# Patient Record
Sex: Female | Born: 1985 | Hispanic: Yes | Marital: Married | Smoking: Never smoker
Health system: Southern US, Community
[De-identification: ages and names within clinical notes are randomized; demographics above are authoritative.]

## PROBLEM LIST (undated history)

## (undated) DIAGNOSIS — E119 Type 2 diabetes mellitus without complications: Secondary | ICD-10-CM

## (undated) DIAGNOSIS — Z789 Other specified health status: Secondary | ICD-10-CM

## (undated) HISTORY — PX: APPENDECTOMY: SHX54

---

## 2020-12-14 NOTE — L&D Delivery Note (Addendum)
LABOR COURSE Patient presented for an IOL on 8/25 for poorly controlled GDM. One arrival cervical exam was 2/40/-2. Induction was started with a dose of Cytotec at 2348 and placement of a foley balloon. Foley fell out at 0215. She was contracting spontaneously and was managed expectantly. She progressed to complete at 0944.   Delivery Note Called to room and patient was complete and pushing. Head delivered LOA. No nuchal cord present. Shoulders delivered easily. At [phone] a viable female was delivered via Vaginal, Spontaneous vertex presentation.  Infant with spontaneous cry, placed on mother's abdomen, dried and stimulated. Cord clamped x 2 after 1-minute delay, and cut by FOB under direct supervision. Cord blood drawn. Placenta delivered spontaneously with gentle cord traction. Appeared intact. Fundus firm with massage and Pitocin. TXA given due to increased bleeding after delivery of placenta. Labia, perineum, vagina, and cervix inspected with small vaginal floor abrasion that was hemostatic, not needing repair.   Patient continued to have uterine bleeding after delivery despite fundal massage, Pitocin, and TXA. Patient received rectal Cytotec 800 mcg in addition to Methergine with improvement in bleeding. EBL .   APGAR: 8, 9; Weight 3209 g   Cord: 3VC   Anesthesia: Epidural Episiotomy: None Lacerations: Small vaginal floor abrasion, hemostatic, not needing repair Est. Blood Loss (mL): 750 cc  Mom to postpartum.  Baby to Couplet care / Skin to Skin.  Alicia Amel, MD 08/08/21 10:29 AM    GME ATTESTATION:  I saw and evaluated the patient. I agree with the findings and the plan of care as documented in the resident's note. I was present and gloved for entire delivery and management of patient's care.   Significant EBL post-delivery. Will continue to monitor bleeding closely with plan for Jada placement if bleeding does not continue to improve.  Evalina Field, MD OB Fellow,  Faculty [facility], Center for [facility] 08/08/2021 4:40 PM

## 2021-03-16 ENCOUNTER — Inpatient Hospital Stay (HOSPITAL_BASED_OUTPATIENT_CLINIC_OR_DEPARTMENT_OTHER): Payer: Medicaid Other

## 2021-03-16 ENCOUNTER — Inpatient Hospital Stay (HOSPITAL_COMMUNITY)
Admission: AD | Admit: 2021-03-16 | Discharge: 2021-03-16 | Disposition: A | Discharge status: Home / Self Care | Payer: Medicaid Other | Attending: Obstetrics and Gynecology | Admitting: Obstetrics and Gynecology

## 2021-03-16 ENCOUNTER — Other Ambulatory Visit: Payer: Self-pay

## 2021-03-16 ENCOUNTER — Encounter (HOSPITAL_COMMUNITY): Payer: Self-pay | Admitting: Specialist

## 2021-03-16 DIAGNOSIS — R109 Unspecified abdominal pain: Secondary | ICD-10-CM

## 2021-03-16 DIAGNOSIS — M549 Dorsalgia, unspecified: Secondary | ICD-10-CM | POA: Diagnosis not present

## 2021-03-16 DIAGNOSIS — Z043 Encounter for examination and observation following other accident: Secondary | ICD-10-CM

## 2021-03-16 DIAGNOSIS — Z3A17 17 weeks gestation of pregnancy: Secondary | ICD-10-CM

## 2021-03-16 DIAGNOSIS — O322XX0 Maternal care for transverse and oblique lie, not applicable or unspecified: Secondary | ICD-10-CM | POA: Diagnosis not present

## 2021-03-16 DIAGNOSIS — W19XXXA Unspecified fall, initial encounter: Secondary | ICD-10-CM

## 2021-03-16 DIAGNOSIS — O9A212 Injury, poisoning and certain other consequences of external causes complicating pregnancy, second trimester: Secondary | ICD-10-CM | POA: Diagnosis not present

## 2021-03-16 DIAGNOSIS — O26899 Other specified pregnancy related conditions, unspecified trimester: Secondary | ICD-10-CM

## 2021-03-16 DIAGNOSIS — O26892 Other specified pregnancy related conditions, second trimester: Secondary | ICD-10-CM | POA: Diagnosis not present

## 2021-03-16 DIAGNOSIS — O99891 Other specified diseases and conditions complicating pregnancy: Secondary | ICD-10-CM

## 2021-03-16 DIAGNOSIS — Z3A18 18 weeks gestation of pregnancy: Secondary | ICD-10-CM | POA: Insufficient documentation

## 2021-03-16 DIAGNOSIS — O0932 Supervision of pregnancy with insufficient antenatal care, second trimester: Secondary | ICD-10-CM | POA: Diagnosis not present

## 2021-03-16 HISTORY — DX: Other specified health status: Z78.9

## 2021-03-16 LAB — URINALYSIS, ROUTINE W REFLEX MICROSCOPIC
Bilirubin Urine: NEGATIVE
Glucose, UA: NEGATIVE mg/dL
Hgb urine dipstick: NEGATIVE
Ketones, ur: 5 mg/dL — AB
Leukocytes,Ua: NEGATIVE
Nitrite: NEGATIVE
Protein, ur: NEGATIVE mg/dL
Specific Gravity, Urine: 1.027 (ref 1.005–1.030)
pH: 5 (ref 5.0–8.0)

## 2021-03-16 MED ORDER — CYCLOBENZAPRINE HCL 5 MG PO TABS
10.0000 mg | ORAL_TABLET | Freq: Once | ORAL | Status: AC
  Administered 2021-03-16: 10 mg via ORAL
  Filled 2021-03-16: qty 2

## 2021-03-16 MED ORDER — ACETAMINOPHEN 500 MG PO TABS
1000.0000 mg | ORAL_TABLET | Freq: Once | ORAL | Status: AC
  Administered 2021-03-16: 1000 mg via ORAL
  Filled 2021-03-16: qty 2

## 2021-03-16 MED ORDER — CYCLOBENZAPRINE HCL 10 MG PO TABS: 10.0000 mg | ORAL_TABLET | Freq: Two times a day (BID) | ORAL | 0 refills | Status: DC | PRN

## 2021-03-16 NOTE — Discharge Instructions (Signed)
Prenatal Care Providers   [facility] Haskel [phone] [facility]- [phone] [facility]- [phone]  Safe Medications in Pregnancy   Acne: Benzoyl Peroxide Salicylic Acid  Backache/Headache: Tylenol: 2 regular strength every 4 hours OR              2 Extra strength every 6 hours  Colds/Coughs/Allergies: Benadryl (alcohol free) 25 mg every 6 hours as needed Breath right strips Claritin Cepacol throat lozenges Chloraseptic throat spray Cold-Eeze- up to three times per day Cough drops, alcohol free Flonase (by prescription only) Guaifenesin Mucinex Robitussin DM (plain only, alcohol free) Saline nasal spray/drops Sudafed (pseudoephedrine) & Actifed ** use only after [redacted] weeks gestation and if you do not have high blood pressure Tylenol Vicks Vaporub Zinc lozenges Zyrtec   Constipation: Colace Ducolax suppositories Fleet enema Glycerin suppositories Metamucil Milk of magnesia Miralax Senokot Smooth move tea  Diarrhea: Kaopectate Imodium A-D  *NO pepto Bismol  Hemorrhoids: Anusol Anusol HC Preparation H Tucks  Indigestion: Tums Maalox Mylanta Zantac  Pepcid  Insomnia: Benadryl (alcohol free) 25mg  every 6 hours as needed Tylenol PM Unisom, no Gelcaps  Leg Cramps: Tums MagGel  Nausea/Vomiting:  Bonine Dramamine Emetrol Ginger extract Sea bands Meclizine  Nausea medication to take during pregnancy:  Unisom (doxylamine succinate 25 mg tablets) Take one tablet daily at bedtime. If symptoms are not adequately controlled, the dose can be increased to a maximum recommended dose of two tablets daily (1/2 tablet in the morning, 1/2 tablet mid-afternoon and one at bedtime). Vitamin B6 100mg  tablets. Take one tablet twice a day (up to 200 mg per day).  Skin Rashes: Aveeno products Benadryl cream or 25mg  every 6 hours as needed Calamine Lotion 1% cortisone cream  Yeast infection: Gyne-lotrimin 7 Monistat 7   **If taking  multiple medications, please check labels to avoid duplicating the same active ingredients **take medication as directed on the label ** Do not exceed 4000 mg of tylenol in 24 hours **Do not take medications that contain aspirin or ibuprofen

## 2021-03-16 NOTE — MAU Note (Signed)
Pt reports to mau with c/o lower back pain after falling in the shower 2 days ago.  Pt states she doesn't know how she fell or landed.  Pt states she is also having pain when the "baby moves".  Denies bleeding.  Reports she has not started prenatal care yet.

## 2021-03-16 NOTE — MAU Provider Note (Signed)
History     CSN: [id number]  Arrival date and time: 03/16/21 1530   Event Date/Time   First Provider Initiated Contact with Patient 03/16/21 1833      Chief Complaint  Patient presents with  . Fall  . Back Pain   HPI Diana Rodgers is a 35 y.o. G3P2002 at [redacted]w[redacted]d who presents stating 2 days ago she was in the shower and passed out. She states the water was really hot and she feels like she got over heated. She is unsure how she fell or if she hit anything. She is unsure how long she was unconscious. She states since then she she has had lower back and lower abdominal pain and she has been unable to sleep because of it. She denies any vaginal bleeding or leaking of fluid. She states it hurts when the baby moves. She has not had any care or ultrasounds yet.   OB History    Gravida  3   Para  2   Term  2   Preterm      AB      Living  2     SAB      IAB      Ectopic      Multiple      Live Births  2           Past Medical History:  Diagnosis Date  . Medical history non-contributory     Past Surgical History:  Procedure Laterality Date  . APPENDECTOMY      History reviewed. No pertinent family history.  Social History   Tobacco Use  . Smoking status: Never Smoker  . Smokeless tobacco: Never Used  Vaping Use  . Vaping Use: Never used  Substance Use Topics  . Alcohol use: Not Currently  . Drug use: Never    Allergies: No Known Allergies  No medications prior to admission.    Review of Systems  Constitutional: Negative.  Negative for fatigue and fever.  HENT: Negative.   Respiratory: Negative.  Negative for shortness of breath.   Cardiovascular: Negative.  Negative for chest pain.  Gastrointestinal: Positive for abdominal pain. Negative for constipation, diarrhea, nausea and vomiting.  Genitourinary: Negative.  Negative for dysuria, vaginal bleeding and vaginal discharge.  Musculoskeletal: Positive for back pain.  Neurological: Negative.   Negative for dizziness and headaches.   Physical Exam   Blood pressure 118/64, pulse 72, temperature 98.1 F (36.7 C), temperature source Oral, resp. rate 16, SpO2 97 %.  Physical Exam Vitals and nursing note reviewed.  Constitutional:      General: She is not in acute distress.    Appearance: She is well-developed.  HENT:     Head: Normocephalic.  Eyes:     Pupils: Pupils are equal, round, and reactive to light.  Cardiovascular:     Rate and Rhythm: Normal rate and regular rhythm.     Heart sounds: Normal heart sounds.  Pulmonary:     Effort: Pulmonary effort is normal. No respiratory distress.     Breath sounds: Normal breath sounds.  Abdominal:     General: Bowel sounds are normal. There is no distension.     Palpations: Abdomen is soft.     Tenderness: There is no abdominal tenderness.  Skin:    General: Skin is warm and dry.  Neurological:     Mental Status: She is alert and oriented to person, place, and time.  Psychiatric:        Behavior:  Behavior normal.        Thought Content: Thought content normal.        Judgment: Judgment normal.    FHT: 147 bpm  Cervix: closed/thick/posterior  MAU Course  Procedures Results for orders placed or performed during the hospital encounter of 03/16/21 (from the past 24 hour(s))  Urinalysis, Routine w reflex microscopic Urine, Clean Catch     Status: Abnormal   Collection Time: 03/16/21  6:18 PM  Result Value Ref Range   Color, Urine AMBER (A) YELLOW   APPearance HAZY (A) CLEAR   Specific Gravity, Urine 1.027 1.005 - 1.030   pH 5.0 5.0 - 8.0   Glucose, UA NEGATIVE NEGATIVE mg/dL   Hgb urine dipstick NEGATIVE NEGATIVE   Bilirubin Urine NEGATIVE NEGATIVE   Ketones, ur 5 (A) NEGATIVE mg/dL   Protein, ur NEGATIVE NEGATIVE mg/dL   Nitrite NEGATIVE NEGATIVE   Leukocytes,Ua NEGATIVE NEGATIVE   MDM UA [place] MFM OB Limited- no evidence of abruption or previa Tylenol- patient reports improvement of abdominal pain but still  rates back pain a 7/10 Flexeril PO  Assessment and Plan   1. Back pain affecting pregnancy in second trimester   2. Abdominal pain affecting pregnancy   3. [redacted] weeks gestation of pregnancy    -Discharge home in stable condition -Second trimester precautions discussed -Patient advised to follow-up with OB of choice to establish prenatal care -Patient may return to MAU as needed or if her condition were to change or worsen  Rolm Bookbinder CNM 03/16/2021, 6:33 PM

## 2021-04-08 ENCOUNTER — Other Ambulatory Visit: Payer: Self-pay

## 2021-04-08 ENCOUNTER — Ambulatory Visit (INDEPENDENT_AMBULATORY_CARE_PROVIDER_SITE_OTHER): Payer: Medicaid Other | Admitting: *Deleted

## 2021-04-08 VITALS — BP 115/73 | HR 82 | Temp 98.1°F | Ht 62.0 in | Wt 161.0 lb

## 2021-04-08 DIAGNOSIS — O0930 Supervision of pregnancy with insufficient antenatal care, unspecified trimester: Secondary | ICD-10-CM | POA: Insufficient documentation

## 2021-04-08 DIAGNOSIS — Z3480 Encounter for supervision of other normal pregnancy, unspecified trimester: Secondary | ICD-10-CM | POA: Insufficient documentation

## 2021-04-08 NOTE — Progress Notes (Signed)
   Location: [facility]  Patient: clinic Provider: clinic  PRENATAL INTAKE SUMMARY  Ms. Murdoch presents today New OB Nurse Interview.  OB History    Gravida  3   Para  2   Term  2   Preterm      AB      Living  2     SAB      IAB      Ectopic      Multiple      Live Births  2          I have reviewed the patient's medical, obstetrical, social, and family histories, medications, and available lab results.  SUBJECTIVE She has no unusual complaints  OBJECTIVE Initial Nurse interview for history/labs (New OB)  EDD: 08/22/21 by Ronny Bacon: [redacted]w[redacted]d G3P2002  FHT: 148  GENERAL APPEARANCE: alert, well appearing, in no apparent distress, oriented to person, place and time  LATE TO CARE  ASSESSMENT Normal pregnancy  PLAN Prenatal care:  [facility] Pnl/HIV/Hep C OB Urine Culture GC/CT/PAP at next visit with Raelyn Mora, CNM HgbEval/SMA/CF ([facility]) Panorama A1C Ultrasound complete MFM >14 week for anatomy scan  Follow Up Instructions:   I discussed the assessment and treatment plan with the patient. The patient was provided an opportunity to ask questions and all were answered. The patient agreed with the plan and demonstrated an understanding of the instructions.   The patient was advised to call back or seek an in-person evaluation if the symptoms worsen or if the condition fails to improve as anticipated.  I provided 40 minutes of  face-to-face time during this encounter.  Clovis Pu, RN

## 2021-04-08 NOTE — Patient Instructions (Addendum)
Genetic Screening Results Information: You are having genetic testing called [facility] today.  It will take approximately 2 weeks before the results are available.  To get your results, you need Internet access to a web browser to search [place]/MyChart (the direct app on your phone will not give you these results).  Then select Lab Scanned and click on the blue hyper link that says View Image to see your Panorama results.  You can also use the directions on the purple card given to look up your results directly on the [place] website.   Second Trimester of Pregnancy  The second trimester of pregnancy is from week 13 through week 27. This is also called months 4 through 6 of pregnancy. This is often the time when you feel your best. During the second trimester:  Morning sickness is less or has stopped.  You may have more energy.  You may feel hungry more often. At this time, your unborn baby (fetus) is growing very fast. At the end of the sixth month, the unborn baby may be up to 12 inches long and weigh about 1 pounds. You will likely start to feel the baby move between 16 and 20 weeks of pregnancy. Body changes during your second trimester Your body continues to go through many changes during this time. The changes vary and generally return to normal after the baby is born. Physical changes  You will gain more weight.  You may start to get stretch marks on your hips, belly (abdomen), and breasts.  Your breasts will grow and may hurt.  Dark spots or blotches may develop on your face.  A dark line from your belly button to the pubic area (linea nigra) may appear.  You may have changes in your hair. Health changes  You may have headaches.  You may have heartburn.  You may have trouble pooping (constipation).  You may have hemorrhoids or swollen, bulging veins (varicose veins).  Your gums may bleed.  You may pee (urinate) more often.  You may have back pain. Follow  these instructions at home: Medicines  Take over-the-counter and prescription medicines only as told by your doctor. Some medicines are not safe during pregnancy.  Take a prenatal vitamin that contains at least 600 micrograms (mcg) of folic acid. Eating and drinking  Eat healthy meals that include: ? Fresh fruits and vegetables. ? Whole grains. ? Good sources of protein, such as meat, eggs, or tofu. ? Low-fat dairy products.  Avoid raw meat and unpasteurized juice, milk, and cheese.  You may need to take these actions to prevent or treat trouble pooping: ? Drink enough fluids to keep your pee (urine) pale yellow. ? Eat foods that are high in fiber. These include beans, whole grains, and fresh fruits and vegetables. ? Limit foods that are high in fat and sugar. These include fried or sweet foods. Activity  Exercise only as told by your doctor. Most people can do their usual exercise during pregnancy. Try to exercise for 30 minutes at least 5 days a week.  Stop exercising if you have pain or cramps in your belly or lower back.  Do not exercise if it is too hot or too humid, or if you are in a place of great height (high altitude).  Avoid heavy lifting.  If you choose to, you may have sex unless your doctor tells you not to. Relieving pain and discomfort  Wear a good support bra if your breasts are sore.  Take  warm water baths (sitz baths) to soothe pain or discomfort caused by hemorrhoids. Use hemorrhoid cream if your doctor approves.  Rest with your legs raised (elevated) if you have leg cramps or low back pain.  If you develop bulging veins in your legs: ? Wear support hose as told by your doctor. ? Raise your feet for 15 minutes, 3-4 times a day. ? Limit salt in your food. Safety  Wear your seat belt at all times when you are in a car.  Talk with your doctor if someone is hurting you or yelling at you a lot. Lifestyle  Do not use hot tubs, steam rooms, or  saunas.  Do not douche. Do not use tampons or scented sanitary pads.  Avoid cat litter boxes and soil used by cats. These carry germs that can harm your baby and can cause a loss of your baby by miscarriage or stillbirth.  Do not use herbal medicines, illegal drugs, or medicines that are not approved by your doctor. Do not drink alcohol.  Do not smoke or use any products that contain nicotine or tobacco. If you need help quitting, ask your doctor. General instructions  Keep all follow-up visits. This is important.  Ask your doctor about local prenatal classes.  Ask your doctor about the right foods to eat or for help finding a counselor. Where to find more information  American Pregnancy Association: americanpregnancy.org  [employer] of [employer]ians [employer] Gynecologists: www.acog.org  Office on [employer]: KeywordPortfolios.com.br Contact a doctor if:  You have a headache that does not go away when you take medicine.  You have changes in how you see, or you see spots in front of your eyes.  You have mild cramps, pressure, or pain in your lower belly.  You continue to feel like you may vomit (nauseous), you vomit, or you have watery poop (diarrhea).  You have bad-smelling fluid coming from your vagina.  You have pain when you pee or your pee smells bad.  You have very bad swelling of your face, hands, ankles, feet, or legs.  You have a fever. Get help right away if:  You are leaking fluid from your vagina.  You have spotting or bleeding from your vagina.  You have very bad belly cramping or pain.  You have trouble breathing.  You have chest pain.  You faint.  You have not felt your baby move for the time period told by your doctor.  You have new or increased pain, swelling, or redness in an arm or leg. Summary  The second trimester of pregnancy is from week 13 through week 27 (months 4 through 6).  Eat healthy meals.  Exercise as told by your  doctor. Most people can do their usual exercise during pregnancy.  Do not use herbal medicines, illegal drugs, or medicines that are not approved by your doctor. Do not drink alcohol.  Call your doctor if you get sick or if you notice anything unusual about your pregnancy. This information is not intended to replace advice given to you by your health care provider. Make sure you discuss any questions you have with your health care provider. Document Revised: 05/08/2020 Document Reviewed: 03/14/2020 Elsevier Patient Education  2021 [place].  Warning Signs During Pregnancy During pregnancy, your body goes through many changes. Some changes may be uncomfortable, but most do not represent a serious problem. However, it is important to learn when certain signs and symptoms may indicate a problem. Talk with your health  care provider about your current health and any medical conditions you have. Make sure you know the symptoms to watch for and report. How does this affect me? Warning signs during pregnancy Let your health care provider know if you have any of the following warning signs:  Dizziness or feeling faint.  Nausea, vomiting, or diarrhea that lasts 24 hours or longer.  Spotting or bleeding from your vagina.  Abdominal cramping or pain in your pelvis or lower back.  Shortness of breath, difficulty breathing, or chest pain.  New or increased pain, swelling, or redness in an arm or leg.  Your baby is moving less than usual or is not moving. You should also watch for signs of a serious medical condition called preeclampsia. This may include:  A severe, throbbing headache that does not go away.  Vision changes, such as blurred or double vision, light sensitivity, or seeing spots in front of your eyes.  Sudden or extreme swelling of your face, hands, legs, or feet. Pregnancy causes changes that may make it more likely for you to get an infection. Let your health care provider  know if you have signs of infection, such as:  A fever.  A bad-smelling vaginal discharge.  Pain or burning when you urinate. How does this affect my baby? Throughout your pregnancy, always report any of the warning signs of a problem to your health care provider. This can help prevent complications that may affect your baby, including:  Increased risk for premature birth.  Infection that may be transmitted to your baby.  Increased risk for stillbirth. Follow these instructions at home:  Take over-the-counter and prescription medicines only as told by your health care provider.  Keep all follow-up visits. This is important.   Where to find more information  Office on Women's Health: DexterApartments.fr  [employer] of [employer]stetricians and Gynecologists: SwimmingTub.com.br Contact a health care provider if:  You have any warning signs of problems during your pregnancy.  Any of the following apply to you during your pregnancy: ? You have strong emotions, such as sadness or anxiety, that interfere with work or personal relationships. ? You feel unsafe in your home. ? You are using tobacco products, alcohol, or drugs, and you need help to stop. Get help right away if:  You have signs or symptoms of labor before 37 weeks of pregnancy. These include: ? Contractions that are 5 minutes or less apart, or that increase in frequency, intensity, or length. ? Sudden, sharp abdominal pain or low back pain. ? Uncontrolled gush or trickle of fluid from your vagina. Summary  Always report any warning signs to your health care provider to prevent complications that may affect both you and your baby.  Talk with your health care provider about your current health and any medical conditions you have. Make sure you know the symptoms to watch for and report.  Keep all follow-up visits. This is important. This information is not intended to replace advice given to you by  your health care provider. Make sure you discuss any questions you have with your health care provider. Document Revised: 05/08/2020 Document Reviewed: 04/05/2020 Elsevier Patient Education  2021 [place] [facility]ACTICE PHYSICIANS  [facility] [address] [place], [place] [address] Phone - [phone]   Fax - [place] [address] [place], [place]  [address] Phone - [phone]   Fax - [phone]  [place] [address]  [place], [place]  [address] Phone - [phone]   Fax - [phone]  [facility] [address] [place], [place]  [address] Phone - [phone]   Fax - [phone]  [facility] [address] [place], [place]  [address] Phone - [phone]   Fax - [phone]  [facility] [address] [place], [place]  [address] Phone - [phone]   Fax - [phone]  [facility] [address] [place], [place]  [address] Phone - [phone]   Fax - [phone]  [facility] [address] [place], [place]  [address] Phone - [phone]   Fax - [phone]  [facility] [address] [place], [place]  [address] Phone - [phone]   Fax - [phone] [facility] [address] [place], [place]  [address] Phone - [phone]   Fax - [phone]  [facility] [address] [place], [place]  [address] Phone - [phone]   Fax - [phone]  [facility] [address] [place], [place]  [address] Phone - [phone]   Fax - [phone]  [facility] [address] [place], [place]  [address] Phone - [phone]   Fax - [phone]  [facility] [address] [place], [place]  [address] Phone - [phone]  [facility] [address] [place], [place]  [address] Phone - [phone]   Fax - [phone]  [facility] [address] [place], [place]  [address] Phone - [phone]   Fax - [phone]  [facility] [address] [place], [place]  [address] Phone - [phone]   Fax - [phone]  [facility] [address] [place], [place]  [address] Phone - [phone]   Fax - [phone]   [facility] [address] [place], [place]  [address] Phone - [phone]   Fax - [phone]  [facility] [address] [place], [place]  [address] Phone - [phone]   Fax - [phone]  [facility] [address] [place], [place]  [address] Phone - [phone]   Fax - [phone]  [facility] [address] [place], [place]  [address] Phone - [phone]   Fax - [phone]  [facility] [address] [place], [place]  [address] Phone - [phone]   Fax - [phone] [facility] [address] [place], [place]  [address] Phone - [phone]   Fax - [phone]  [place] [address] [place],   [address] Phone - [phone]   Fax - [phone]  [facility] [address] [place],   [address] Phone - [phone]   Fax - [phone]   

## 2021-04-10 LAB — CBC/D/PLT+RPR+RH+ABO+RUB AB...
Antibody Screen: NEGATIVE
Basophils Absolute: 0.1 10*3/uL (ref 0.0–0.2)
Basos: 0 %
EOS (ABSOLUTE): 0.1 10*3/uL (ref 0.0–0.4)
Eos: 1 %
HCV Ab: <0.1 s/co ratio (ref 0.0–0.9)
HIV Screen 4th Generation wRfx: NONREACTIVE
Hematocrit: 41.5 % (ref 34.0–46.6)
Hemoglobin: 14.3 g/dL (ref 11.1–15.9)
Hepatitis B Surface Ag: NEGATIVE
Immature Grans (Abs): 0.2 10*3/uL — ABNORMAL HIGH (ref 0.0–0.1)
Immature Granulocytes: 2 %
Lymphocytes Absolute: 2.1 10*3/uL (ref 0.7–3.1)
Lymphs: 17 %
MCH: 31.5 pg (ref 26.6–33.0)
MCHC: 34.5 g/dL (ref 31.5–35.7)
MCV: 91 fL (ref 79–97)
Monocytes Absolute: 0.7 10*3/uL (ref 0.1–0.9)
Monocytes: 6 %
Neutrophils Absolute: 9.4 10*3/uL — ABNORMAL HIGH (ref 1.4–7.0)
Neutrophils: 74 %
Platelets: 312 10*3/uL (ref 150–450)
RBC: 4.54 x10E6/uL (ref 3.77–5.28)
RDW: 12.2 % (ref 11.7–15.4)
RPR Ser Ql: NONREACTIVE
Rh Factor: POSITIVE
Rubella Antibodies, IGG: 0.9 index — ABNORMAL LOW (ref >0.99)
WBC: 12.6 10*3/uL — ABNORMAL HIGH (ref 3.4–10.8)

## 2021-04-10 LAB — AFP, SERUM, OPEN SPINA BIFIDA
AFP MoM: 1.91
AFP Value: 109.2 ng/mL
Gest. Age on Collection Date: 20 weeks
Maternal Age At EDD: 32.9 yr
OSBR Risk 1 IN: 987
Test Results:: NEGATIVE
Weight: 161 [lb_av]

## 2021-04-10 LAB — HEMOGLOBIN A1C
Est. average glucose Bld gHb Est-mCnc: 105 mg/dL
Hgb A1c MFr Bld: 5.3 % (ref 4.8–5.6)

## 2021-04-10 LAB — URINE CULTURE, OB REFLEX

## 2021-04-10 LAB — HCV INTERPRETATION

## 2021-04-10 LAB — CULTURE, OB URINE

## 2021-04-22 ENCOUNTER — Telehealth: Payer: Self-pay | Admitting: *Deleted

## 2021-04-22 NOTE — Telephone Encounter (Signed)
Patient called requesting prenatal labs. Patient verified DOB. Prenatal labs normal, other than rubella non-immune. Advised patient that she will be offered MMR after delivery at the hospital. If does not want injection at the hospital, she can call her PCP or local health department for an appointment. Genetic screening all negative.   Derl Barrow, RN

## 2021-04-24 ENCOUNTER — Ambulatory Visit (INDEPENDENT_AMBULATORY_CARE_PROVIDER_SITE_OTHER): Payer: Medicaid Other | Admitting: Obstetrics and Gynecology

## 2021-04-24 ENCOUNTER — Other Ambulatory Visit: Payer: Self-pay

## 2021-04-24 ENCOUNTER — Other Ambulatory Visit (HOSPITAL_COMMUNITY)
Admission: RE | Admit: 2021-04-24 | Discharge: 2021-04-24 | Disposition: A | Discharge status: Home / Self Care | Payer: Medicaid Other | Source: Ambulatory Visit | Attending: Obstetrics and Gynecology | Admitting: Obstetrics and Gynecology

## 2021-04-24 ENCOUNTER — Encounter: Payer: Self-pay | Admitting: Obstetrics and Gynecology

## 2021-04-24 VITALS — BP 109/69 | HR 80 | Temp 98.1°F | Wt 162.6 lb

## 2021-04-24 DIAGNOSIS — Z124 Encounter for screening for malignant neoplasm of cervix: Secondary | ICD-10-CM | POA: Insufficient documentation

## 2021-04-24 DIAGNOSIS — O26892 Other specified pregnancy related conditions, second trimester: Secondary | ICD-10-CM

## 2021-04-24 DIAGNOSIS — Z3A22 22 weeks gestation of pregnancy: Secondary | ICD-10-CM | POA: Insufficient documentation

## 2021-04-24 DIAGNOSIS — B373 Candidiasis of vulva and vagina: Secondary | ICD-10-CM | POA: Diagnosis not present

## 2021-04-24 DIAGNOSIS — Z3480 Encounter for supervision of other normal pregnancy, unspecified trimester: Secondary | ICD-10-CM | POA: Diagnosis not present

## 2021-04-24 DIAGNOSIS — O0930 Supervision of pregnancy with insufficient antenatal care, unspecified trimester: Secondary | ICD-10-CM

## 2021-04-24 DIAGNOSIS — O3432 Maternal care for cervical incompetence, second trimester: Secondary | ICD-10-CM

## 2021-04-24 DIAGNOSIS — O0932 Supervision of pregnancy with insufficient antenatal care, second trimester: Secondary | ICD-10-CM

## 2021-04-24 DIAGNOSIS — R102 Pelvic and perineal pain: Secondary | ICD-10-CM | POA: Diagnosis not present

## 2021-04-24 DIAGNOSIS — B3731 Acute candidiasis of vulva and vagina: Secondary | ICD-10-CM

## 2021-04-24 MED ORDER — TERCONAZOLE 0.4 % VA CREA: 1.0000 | TOPICAL_CREAM | Freq: Every day | VAGINAL | 0 refills | Status: AC

## 2021-04-24 MED ORDER — COMFORT FIT MATERNITY SUPP SM MISC: 1.0000 [IU] | Freq: Every day | 0 refills | Status: DC | PRN

## 2021-04-24 NOTE — Progress Notes (Signed)
INITIAL OBSTETRICAL VISIT Patient name: Diana Rodgers MRN [id number]  Date of birth: 1986/07/27 Chief Complaint:   Initial Prenatal Visit  History of Present Illness:   Diana Rodgers is a 35 y.o. G13P2002 Hispanic female at [redacted]w[redacted]d by LMP with an Estimated Date of Delivery: 08/22/21 being seen today for her initial obstetrical visit.  Her obstetrical history is significant for multigravida and late to prenatal care. This is a planned pregnancy. Her previous regnancies were uncomplicated. She and the father of the baby (FOB) "Jesus" live together. She has a support system that consists of S.O./children/her family/friends. Today she reports "lots of cramping and pelvic pressure. She reports the crmaping starts at the top of her belly and goes down. 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 She still takes Flexeril prn; helps a little.   No LMP recorded. Patient is pregnant. Last pap unknown. Results were: unknown Review of Systems:   Pertinent items are noted in HPI Denies cramping/contractions, leakage of fluid, vaginal bleeding, abnormal vaginal discharge w/ itching/odor/irritation, headaches, visual changes, shortness of breath, chest pain, abdominal pain, severe nausea/vomiting, or problems with urination or bowel movements unless otherwise stated above.  Pertinent History Reviewed:  Reviewed past medical,surgical, social, obstetrical and family history.  Reviewed problem list, medications and allergies. OB History  Gravida Para Term Preterm AB Living  3 2 2     2   SAB IAB Ectopic Multiple Live Births          2    # Outcome Date GA Lbr Len/2nd Weight Sex Delivery Anes PTL Lv  3 Current           2 Term 02/22/12 [redacted]w[redacted]d  8 lb (3.629 kg) M Vag-Spont EPI N LIV  1 Term 09/13/10 [redacted]w[redacted]d  7 lb (3.175 kg) F Vag-Spont EPI N LIV   Physical Assessment:   Vitals:   04/24/21 1007  BP: 109/69  Pulse: 80   Temp: 98.1 F (36.7 C)  Weight: 162 lb 9.6 oz (73.8 kg)  Body mass index is 29.74 kg/m.       Physical Examination:  General appearance - well appearing, and in no distress  Mental status - alert, oriented to person, place, and time  Psych:  She has a normal mood and affect  Skin - warm and dry, normal color, no suspicious lesions noted  Chest - effort normal, all lung fields clear to auscultation bilaterally  Heart - normal rate and regular rhythm  Abdomen - soft, nontender  Extremities:  No swelling or varicosities noted  Pelvic - VULVA: normal appearing vulva with no masses, tenderness or lesions  VAGINA: normal appearing vagina with normal color and discharge, no lesions.   CERVIX: normal appearing cervix without discharge or lesions, no CMT  Thin prep pap is done with reflex HR HPV cotesting   FHTs by doppler: 144 bpm  Assessment & Plan:  1. High-Risk Pregnancy G3P2002 at [redacted]w[redacted]d with an Estimated Date of Delivery: 08/22/21   2. Initial OB visit - Welcomed to practice and introduced self to patient in addition to discussing other advanced practice providers that she may be seeing at this practice - Congratulated patient - Anticipatory guidance on upcoming appointments - Educated on COVID19 and pregnancy and the integration of virtual appointments  - Educated on babyscripts app- patient reports she has not received email, encouraged to look in spam folder and to call office if she  still has not received email - patient verbalizes understanding    3. Supervision of other normal pregnancy, antepartum - Cytology - PAP( [place]) - Cervicovaginal ancillary only( [place])  4. Late prenatal care affecting pregnancy, antepartum  5. [redacted] weeks gestation of pregnancy  6. Pap smear for cervical cancer screening - Cytology - PAP( [place])  7. Pelvic pressure in pregnancy, antepartum, second trimester  - Rx for Elastic Bandages & Supports (COMFORT FIT MATERNITY SUPP SM)  MISC  8. Candida vaginitis  - Rx for terconazole (TERAZOL 7) 0.4 % vaginal cream  9. Premature cervical dilation in second trimester - Cervical length measurement with anatomy U/S tomorrow - Return to office for cervical check next week    Meds:  Meds ordered this encounter  Medications  . DISCONTD: Elastic Bandages & Supports (COMFORT FIT MATERNITY SUPP SM) MISC    Sig: 1 Units by Does not apply route daily as needed.    Dispense:  1 each    Refill:  0    Order Specific Question:   Supervising Provider    Answer:   Reva Bores [[id number]]  . Elastic Bandages & Supports (COMFORT FIT MATERNITY SUPP SM) MISC    Sig: 1 Units by Does not apply route daily as needed.    Dispense:  1 each    Refill:  0    Order Specific Question:   Supervising Provider    Answer:   Reva Bores [[id number]]  . terconazole (TERAZOL 7) 0.4 % vaginal cream    Sig: Place 1 applicator vaginally at bedtime for 7 days.    Dispense:  45 g    Refill:  0    Order Specific Question:   Supervising Provider    Answer:   Reva Bores [[id number]]    Initial labs obtained Continue prenatal vitamins Reviewed n/v relief measures and warning s/s to report Reviewed recommended weight gain based on pre-gravid BMI Encouraged well-balanced diet Genetic Screening discussed: results reviewed Cystic fibrosis, SMA, Fragile X screening discussed results reviewed The nature of [facility] with multiple MDs and other Advanced Practice Providers was explained to patient; also emphasized that residents, students are part of our team.  Discussed optimized OB schedule and video visits. Advised can have an in-office visit whenever she feels she needs to be seen.  Does not have own BP cuff. BP cuff Rx faxed today. Explained to patient that BP will be mailed to her house. Check BP weekly, let us know if >140/90. Advised to call during normal business hours and there is an after-hours nurse line available.     Follow-up: Return in about 1 week (around 05/01/2021) for Return OB visit - cervical check.   No orders of the defined types were placed in this encounter.   Raelyn Mora MSN, CNM 04/24/2021

## 2021-04-24 NOTE — Patient Instructions (Addendum)
PREGNANCY SUPPORT BELT: You are not alone, Seventy-five percent of women have some sort of abdominal or back pain at some point in their pregnancy. Your baby is growing at a fast pace, which means that your whole body is rapidly trying to adjust to the changes. As your uterus grows, your back may start feeling a bit under stress and this can result in back or abdominal pain that can go from mild, and therefore bearable, to severe pains that will not allow you to sit or lay down comfortably, When it comes to dealing with pregnancy-related pains and cramps, some pregnant women usually prefer natural remedies, which the market is filled with nowadays. For example, wearing a pregnancy support belt can help ease and lessen your discomfort and pain.  WHAT ARE THE BENEFITS OF WEARING A PREGNANCY SUPPORT BELT? A pregnancy support belt provides support to the lower portion of the belly taking some of the weight of the growing uterus and distributing to the other parts of your body. It is designed make you comfortable and gives you extra support. Over the years, the pregnancy apparel market has been studying the needs and wants of pregnant women and they have come up with the most comfortable pregnancy support belts that woman could ever ask for. In fact, you will no longer have to wear a stretched-out or bulky pregnancy belt that is visible underneath your clothes and makes you feel even more uncomfortable. Nowadays, a pregnancy support belt is made of comfortable and stretchy materials that will not irritate your skin but will actually make you feel at ease and you will not even notice you are wearing it. They are easy to put on and adjust during the day and can be worn at night for additional support.  BENEFITS: . Relives Back pain . Relieves Abdominal Muscle and Leg Pain . Stabilizes the Pelvic Ring . Offers a Cushioned Abdominal Lift Pad . Relieves pressure on the Sciatic Nerve Within Minutes WHERE TO GET  YOUR PREGNANCY BELT:   [facility] [address] [place], [place] [address] [phone]  [facility] [address], [address] [place], [place] [address] [phone]    Preventing Preterm Birth Preterm birth is when a baby is delivered between 20 weeks and 37 weeks of pregnancy. A full-term pregnancy lasts for at least 37 weeks. Preterm birth can be dangerous for your baby because the last few weeks of pregnancy are an important time for your baby to grow and reach a normal birth weight. How can preterm birth affect my baby? Complications of preterm birth may include:  Breathing problems.  Brain damage that affects movement and coordination (cerebral palsy).  Trouble with feeding.  Problems with vision or hearing.  Infections or inflammation of the digestive tract (colitis).  Developmental delays and learning disabilities.  Low birth weight or very low birth weight.  Higher risk for diabetes, heart disease, and high blood pressure later in life. What can increase my risk of having a preterm birth? The exact cause of preterm birth is unknown. The following factors make you more likely to have a preterm birth:  Being diagnosed with placenta previa. This is a condition in which the placenta covers the lowest part of your uterus (cervix), which opens into the vagina.  Certain conditions of your current and past pregnancies, such as: ? Having had a preterm birth before. ? Being pregnant with multiples. ? Waiting less than 6 months between giving birth and becoming pregnant  again. ? Certain abnormalities in your unborn baby. ? Vaginal bleeding during pregnancy. ? Becoming pregnant through in vitro fertilization (IVF).  Being overweight or underweight.  Medical history of: ? STIs (sexually transmitted infections) or other infections of the urinary tract and the vagina. ? Long-term (chronic) illnesses, such as blood clotting  problems, diabetes, or high blood pressure. ? Short cervix.  Lifestyle and environmental factors, such as: ? Using tobacco products or drugs. ? Drinking alcohol. ? Having stress and no social support. ? Violence in the home (domestic violence). ? Being exposed to certain chemicals or pollutants in the environment. What actions can I take to prevent preterm birth? Medical care The most important thing you can do to lower your risk for preterm birth is to get routine medical care during pregnancy (prenatal care). Keep all follow-up visits as told by your health care provider. This is important.  If you have a high risk of preterm birth:  You may be referred to a health care provider who specializes in managing high-risk pregnancies (perinatologist).  You may be given medicine to help prevent preterm birth. Lifestyle Certain lifestyle changes can also lower your risk of preterm birth:  Wait at least 6 months after a pregnancy to become pregnant again.  Get to a healthy weight before getting pregnant. If you are overweight, work with your health care provider to safely lose weight.  Do not use any products that contain nicotine or tobacco, such as cigarettes, e-cigarettes, and chewing tobacco. If you need help quitting, ask your health care provider.  Do not drink alcohol.  Do not use drugs.  Eat a healthy diet.  Manage other medical problems, such as diabetes or high blood pressure.   Where to find support For more support, consider:  Talking with your health care provider.  Talking with a therapist or substance abuse counselor, if you need help quitting.  Working with a Data processing manager or a Systems analyst to maintain a healthy weight.  Joining a support group. Where to find more information Learn more about preventing preterm birth from:  Centers for Disease Control and Prevention: TonerPromos.no  March of Dimes: marchofdimes.org  American Pregnancy Association:  americanpregnancy.org Contact a health care provider if: You have any of the following signs or symptoms of preterm labor before 37 weeks:  A change or increase in vaginal discharge.  Fluid leaking from your vagina.  Pressure or cramps in your lower abdomen.  A backache that does not go away or gets worse.  Regular tightening (contractions) in your lower abdomen. Get help right away if:  You are having regular painful contractions every 5 minutes or less.  Your water breaks. Summary  Preterm birth means having your baby during weeks 20-37 of pregnancy.  Preterm birth may put your baby at risk for physical and mental problems.  The exact cause of preterm birth is unknown. However, being diagnosed with placenta previa or having vaginal bleeding or an STI (sexually transmitted infection) increases your risk for preterm birth.  Getting good prenatal care can help prevent preterm birth. Keep all follow-up visits as told by your health care provider. This is important.  Contact a health care provider if you have signs or symptoms of preterm labor. This information is not intended to replace advice given to you by your health care provider. Make sure you discuss any questions you have with your health care provider. Document Revised: 11/06/2019 Document Reviewed: 11/06/2019 Elsevier Patient Education  2021 [employer]. Preterm Labor The  normal length of a pregnancy is 39-41 weeks. Preterm labor is when labor starts before 37 completed weeks of pregnancy. Babies who are born prematurely and survive may not be fully developed and may be at an increased risk for long-term problems such as cerebral palsy, developmental delays, and vision and hearing problems. Babies who are born too early may have problems soon after birth. Problems may include regulating blood sugar, body temperature, heart rate, and breathing rate. These babies often have trouble with feeding. The risk of having problems  is highest for babies who are born before 34 weeks of pregnancy. What are the causes? The exact cause of this condition is not known. What increases the risk? You are more likely to have preterm labor if you have certain risk factors that relate to your medical history, problems with present and past pregnancies, and lifestyle factors. Medical history  You have abnormalities of the uterus, including a short cervix.  You have STIs (sexually transmitted infections), or other infections of the urinary tract and the vagina.  You have chronic illnesses, such as blood clotting problems, diabetes, or high blood pressure.  You are overweight or underweight. Present and past pregnancies  You have had preterm labor before.  You are pregnant with twins or other multiples.  You have been diagnosed with a condition in which the placenta covers your cervix (placenta previa).  You waited less than 6 months between giving birth and becoming pregnant again.  Your unborn baby has some abnormalities.  You have vaginal bleeding during pregnancy.  You became pregnant through in vitro fertilization (IVF). Lifestyle and environmental factors  You use tobacco products.  You drink alcohol.  You use street drugs.  You have stress and no social support.  You experience domestic violence.  You are exposed to certain chemicals or environmental pollutants. Other factors  You are younger than age 61 or older than age 59. What are the signs or symptoms? Symptoms of this condition include:  Cramps similar to those that can happen during a menstrual period. The cramps may happen with diarrhea.  Pain in the abdomen or lower back.  Regular contractions that may feel like tightening of the abdomen.  A feeling of increased pressure in the pelvis.  Increased watery or bloody mucus discharge from the vagina.  Water breaking (ruptured amniotic sac). How is this diagnosed? This condition is  diagnosed based on:  Your medical history and a physical exam.  A pelvic exam.  An ultrasound.  Monitoring your uterus for contractions.  Other tests, including: ? A swab of the cervix to check for a chemical called fetal fibronectin. ? Urine tests. How is this treated? Treatment for this condition depends on the length of your pregnancy, your condition, and the health of your baby. Treatment may include:  Taking medicines, such as: ? Hormone medicines. These may be given early in pregnancy to help support the pregnancy. ? Medicines to stop contractions. ? Medicines to help mature the baby's lungs. These may be prescribed if the risk of delivery is high. ? Medicines to prevent your baby from developing cerebral palsy.  Bed rest. If the labor happens before 34 weeks of pregnancy, you may need to stay in the hospital.  Delivery of the baby. Follow these instructions at home:  Do not use any products that contain nicotine or tobacco, such as cigarettes, e-cigarettes, and chewing tobacco. If you need help quitting, ask your health care provider.  Do not drink alcohol.  Take  over-the-counter and prescription medicines only as told by your health care provider.  Rest as told by your health care provider.  Return to your normal activities as told by your health care provider. Ask your health care provider what activities are safe for you.  Keep all follow-up visits as told by your health care provider. This is important.   How is this prevented? To increase your chance of having a full-term pregnancy:  Do not use street drugs or medicines that have not been prescribed to you during your pregnancy.  Talk with your health care provider before taking any herbal supplements, even if you have been taking them regularly.  Make sure you gain a healthy amount of weight during your pregnancy.  Watch for infection. If you think that you might have an infection, get it checked right  away. Symptoms of infection may include: ? Fever. ? Abnormal vaginal discharge or discharge that smells bad. ? Pain or burning with urination. ? Needing to urinate urgently. ? Frequently urinating or passing small amounts of urine frequently. ? Blood in your urine. ? Urine that smells bad or unusual.  Tell your health care provider if you have had preterm labor before. Contact a health care provider if:  You think you are going into preterm labor.  You have signs or symptoms of preterm labor.  You have symptoms of infection. Get help right away if:  You are having regular, painful contractions every 5 minutes or less.  Your water breaks. Summary  Preterm labor is labor that starts before you reach 37 weeks of pregnancy.  Delivering your baby early increases your baby's risk of developing lifelong problems.  The exact cause of preterm labor is unknown. However, having an abnormal uterus, an STI (sexually transmitted infection), or vaginal bleeding during pregnancy increases your risk for preterm labor.  Keep all follow-up visits as told by your health care provider. This is important.  Contact a health care provider if you have signs or symptoms of preterm labor. This information is not intended to replace advice given to you by your health care provider. Make sure you discuss any questions you have with your health care provider. Document Revised: 01/02/2020 Document Reviewed: 01/02/2020 Elsevier Patient Education  2021 [employer].

## 2021-04-25 ENCOUNTER — Other Ambulatory Visit: Payer: Self-pay | Admitting: *Deleted

## 2021-04-25 ENCOUNTER — Ambulatory Visit: Payer: Medicaid Other | Attending: Obstetrics and Gynecology

## 2021-04-25 ENCOUNTER — Other Ambulatory Visit: Payer: Self-pay | Admitting: Obstetrics and Gynecology

## 2021-04-25 DIAGNOSIS — Z363 Encounter for antenatal screening for malformations: Secondary | ICD-10-CM | POA: Diagnosis not present

## 2021-04-25 DIAGNOSIS — O0932 Supervision of pregnancy with insufficient antenatal care, second trimester: Secondary | ICD-10-CM | POA: Diagnosis not present

## 2021-04-25 DIAGNOSIS — Z3480 Encounter for supervision of other normal pregnancy, unspecified trimester: Secondary | ICD-10-CM

## 2021-04-25 DIAGNOSIS — O0930 Supervision of pregnancy with insufficient antenatal care, unspecified trimester: Secondary | ICD-10-CM

## 2021-04-25 DIAGNOSIS — Z3A23 23 weeks gestation of pregnancy: Secondary | ICD-10-CM

## 2021-04-25 LAB — CERVICOVAGINAL ANCILLARY ONLY
Bacterial Vaginitis (gardnerella): POSITIVE — AB
Candida Glabrata: NEGATIVE
Candida Vaginitis: POSITIVE — AB
Chlamydia: NEGATIVE
Comment: NEGATIVE
Comment: NEGATIVE
Comment: NEGATIVE
Comment: NEGATIVE
Comment: NEGATIVE
Comment: NORMAL
Neisseria Gonorrhea: NEGATIVE
Trichomonas: NEGATIVE

## 2021-04-29 ENCOUNTER — Encounter (HOSPITAL_COMMUNITY): Payer: Self-pay | Admitting: Obstetrics & Gynecology

## 2021-04-29 ENCOUNTER — Inpatient Hospital Stay (HOSPITAL_COMMUNITY)
Admission: AD | Admit: 2021-04-29 | Discharge: 2021-04-29 | Disposition: A | Discharge status: Home / Self Care | Payer: Medicaid Other | Attending: Obstetrics & Gynecology | Admitting: Obstetrics & Gynecology

## 2021-04-29 ENCOUNTER — Telehealth: Payer: Self-pay | Admitting: Obstetrics and Gynecology

## 2021-04-29 ENCOUNTER — Other Ambulatory Visit: Payer: Self-pay

## 2021-04-29 ENCOUNTER — Telehealth: Payer: Self-pay | Admitting: *Deleted

## 2021-04-29 DIAGNOSIS — M7918 Myalgia, other site: Secondary | ICD-10-CM | POA: Insufficient documentation

## 2021-04-29 DIAGNOSIS — O368120 Decreased fetal movements, second trimester, not applicable or unspecified: Secondary | ICD-10-CM | POA: Insufficient documentation

## 2021-04-29 DIAGNOSIS — R109 Unspecified abdominal pain: Secondary | ICD-10-CM

## 2021-04-29 DIAGNOSIS — O26892 Other specified pregnancy related conditions, second trimester: Secondary | ICD-10-CM

## 2021-04-29 DIAGNOSIS — Z3A23 23 weeks gestation of pregnancy: Secondary | ICD-10-CM | POA: Insufficient documentation

## 2021-04-29 LAB — URINALYSIS, ROUTINE W REFLEX MICROSCOPIC
Bilirubin Urine: NEGATIVE
Glucose, UA: NEGATIVE mg/dL
Hgb urine dipstick: NEGATIVE
Ketones, ur: NEGATIVE mg/dL
Leukocytes,Ua: NEGATIVE
Nitrite: NEGATIVE
Protein, ur: NEGATIVE mg/dL
Specific Gravity, Urine: 1.014 (ref 1.005–1.030)
pH: 7 (ref 5.0–8.0)

## 2021-04-29 LAB — CYTOLOGY - PAP
Comment: NEGATIVE
Diagnosis: NEGATIVE
High risk HPV: NEGATIVE

## 2021-04-29 MED ORDER — METRONIDAZOLE 500 MG PO TABS: 500.0000 mg | ORAL_TABLET | Freq: Two times a day (BID) | ORAL | 0 refills | Status: DC

## 2021-04-29 MED ORDER — ACETAMINOPHEN 500 MG PO TABS
1000.0000 mg | ORAL_TABLET | Freq: Once | ORAL | Status: AC
  Administered 2021-04-29: 1000 mg via ORAL
  Filled 2021-04-29: qty 2

## 2021-04-29 NOTE — Telephone Encounter (Signed)
Called patient to inform her there was not going to be a provider in the office on 05/18. I moved her appointment to Friday, but she stated she had to take her husband to court. She stated she would call back later today to get rescheduled. She has to look at her work schedule.

## 2021-04-29 NOTE — Telephone Encounter (Signed)
-----   Message from Raelyn Mora, [place] sent at 04/28/2021  7:07 PM EDT ----- Please treat for BV then yeast

## 2021-04-29 NOTE — Telephone Encounter (Signed)
Medication for BV and yeast sent to pharmacy by [facility]U provider.  Clovis Pu, RN

## 2021-04-29 NOTE — MAU Note (Signed)
Diana Rodgers is a 35 y.o. at [redacted]w[redacted]d here in MAU reporting: DFM, pt states she hasn't felt baby move since yesterday morning with lower abdominal cramping, 7/10. Pt reports no vaginal discharge or bleeding. Onset of complaint: 04/28/21 in am Pain score: 7/10 There were no vitals filed for this visit.   FHT: 150 Lab orders placed from triage: [facility]

## 2021-04-29 NOTE — Discharge Instructions (Signed)
Abdominal Pain During Pregnancy Abdominal pain is common during pregnancy and has many possible causes. Some causes are more serious than others, and sometimes the cause is not known. Abdominal pain can be a sign that labor is starting. It can also be caused by normal growth of your baby causing stretching of muscles and ligaments during pregnancy. Always tell your health care provider if you have any abdominal pain. Follow these instructions at home:  Do not have sex or put anything in your vagina until your pain goes away completely.  Get plenty of rest until your pain improves.  Drink enough fluid to keep your urine pale yellow.  Take over-the-counter and prescription medicines only as told by your health care provider.  Keep all follow-up visits. This is important.   Contact a health care provider if:  Your pain continues or gets worse after resting.  You have lower abdominal pain that: ? Comes and goes at regular intervals. ? Spreads to your back. ? Is similar to menstrual cramps.  You have pain or burning when you urinate. Get help right away if:  You have a fever, chills, or shortness of breath.  You have vaginal bleeding.  You are leaking fluid or passing tissue from your vagina.  You have vomiting or diarrhea that lasts for more than 24 hours.  Your baby is moving less than usual.  You feel very weak or faint.  You develop severe pain in your upper abdomen. Summary  Abdominal pain is common during pregnancy and has many possible causes.  If you experience abdominal pain during pregnancy, tell your health care provider right away.  Follow your health care provider's home care instructions and keep all follow-up visits as told. This information is not intended to replace advice given to you by your health care provider. Make sure you discuss any questions you have with your health care provider. Document Revised: 08/13/2020 Document Reviewed: 08/13/2020 Elsevier  Patient Education  2021 Elsevier Inc.  

## 2021-04-29 NOTE — MAU Provider Note (Signed)
History     CSN: [id number]  Arrival date and time: 04/29/21 1103   Event Date/Time   First Provider Initiated Contact with Patient 04/29/21 1144      Chief Complaint  Patient presents with  . Decreased Fetal Movement   HPI Diana Rodgers is a 35 y.o. G3P2002 at [redacted]w[redacted]d who presents to MAU with chief complaint of decreased fetal movement. This is a new problem. Patient states she hasn't felt movement since yesterday.  Patient also endorses lower abdominal cramping, new onset this morning. Pain score 7/10. Pain does not radiate. She has not taken medication or tried other treatments for this complaint.  She denies vaginal bleeding, leaking of fluid, dysuria, fever, falls, or recent illness. She receives care with [facility].  OB History     Gravida  3   Para  2   Term  2   Preterm      AB      Living  2      SAB      IAB      Ectopic      Multiple      Live Births  2           Past Medical History:  Diagnosis Date  . Medical history non-contributory     Past Surgical History:  Procedure Laterality Date  . APPENDECTOMY      No family history on file.  Social History   Tobacco Use  . Smoking status: Never Smoker  . Smokeless tobacco: Never Used  Vaping Use  . Vaping Use: Never used  Substance Use Topics  . Alcohol use: Not Currently  . Drug use: Never    Allergies: No Known Allergies  No medications prior to admission.    Review of Systems  Gastrointestinal: Positive for abdominal pain.  All other systems reviewed and are negative.  Physical Exam   Blood pressure 119/72, pulse 82, temperature 98.5 F (36.9 C), temperature source Oral, resp. rate 18, height 5\' 2"  (1.575 m), SpO2 98 %.  Physical Exam Vitals and nursing note reviewed. Exam conducted with a chaperone present.  Constitutional:      Appearance: Normal appearance.  Cardiovascular:     Rate and Rhythm: Normal rate.     Pulses: Normal pulses.     Heart sounds: Normal  heart sounds.  Pulmonary:     Effort: Pulmonary effort is normal.     Breath sounds: Normal breath sounds.  Abdominal:     Comments: Gravid  Genitourinary:    Comments: Pelvic exam: External genitalia normal, vaginal walls pink and well rugated, cervix visually closed, no lesions noted.   Neurological:     Mental Status: She is alert and oriented to person, place, and time.  Psychiatric:        Mood and Affect: Mood normal.        Behavior: Behavior normal.        Thought Content: Thought content normal.        Judgment: Judgment normal.     MAU Course  Procedures  --NST appropriate for gestational age: baseline 69, mod var, + accels, no decels --toco quiet --Pertinent negatives: hx preterm birth, abdominal tenderness, vaginal bleeding, cervical dilation --Palpable fetal movement when hands applied to abdomen, audible movement on monitor. Patient continues to report DFM.  --Patient diagnosed with BV and yeast at previous office visit but only prescribed Terazol. Will send Flagyl now per patient request  Patient Vitals for the past 24 hrs:  BP Temp Temp src Pulse Resp SpO2 Height  04/29/21 1248 119/72 -- -- 82 -- -- --  04/29/21 1129 121/63 98.5 F (36.9 C) Oral 86 18 98 % 5\' 2"  (1.575 m)   Results for orders placed or performed during the hospital encounter of 04/29/21 (from the past 24 hour(s))  Urinalysis, Routine w reflex microscopic Urine, Clean Catch     Status: Abnormal   Collection Time: 04/29/21 12:09 PM  Result Value Ref Range   Color, Urine AMBER (A) YELLOW   APPearance CLOUDY (A) CLEAR   Specific Gravity, Urine 1.014 1.005 - 1.030   pH 7.0 5.0 - 8.0   Glucose, UA NEGATIVE NEGATIVE mg/dL   Hgb urine dipstick NEGATIVE NEGATIVE   Bilirubin Urine NEGATIVE NEGATIVE   Ketones, ur NEGATIVE NEGATIVE mg/dL   Protein, ur NEGATIVE NEGATIVE mg/dL   Nitrite NEGATIVE NEGATIVE   Leukocytes,Ua NEGATIVE NEGATIVE   Meds ordered this encounter  Medications  .  acetaminophen (TYLENOL) tablet 1,000 mg  . metroNIDAZOLE (FLAGYL) 500 MG tablet    Sig: Take 1 tablet (500 mg total) by mouth 2 (two) times daily.    Dispense:  14 tablet    Refill:  0    Order Specific Question:   Supervising Provider    Answer:   05/01/21 Myna Hidalgo    Assessment and Plan  --35 y.o. G3P2002 at [redacted]w[redacted]d  --Tracing appropriate for gestational age --Musculoskeletal pain in pregnancy --Tylenol 650 mg q 4 hours PRN --Discharge home in stable condition  F/U: --Next appointment at [facility] is 05/07/2021  05/09/2021, CNM 04/29/2021, 6:08 PM

## 2021-04-30 ENCOUNTER — Encounter: Payer: Medicaid Other | Admitting: Advanced Practice Midwife

## 2021-05-07 ENCOUNTER — Encounter: Payer: Self-pay | Admitting: Obstetrics and Gynecology

## 2021-05-07 ENCOUNTER — Other Ambulatory Visit: Payer: Self-pay

## 2021-05-07 ENCOUNTER — Ambulatory Visit (INDEPENDENT_AMBULATORY_CARE_PROVIDER_SITE_OTHER): Payer: Medicaid Other | Admitting: Obstetrics and Gynecology

## 2021-05-07 VITALS — BP 104/70 | HR 76 | Temp 98.0°F | Wt 167.0 lb

## 2021-05-07 DIAGNOSIS — O0990 Supervision of high risk pregnancy, unspecified, unspecified trimester: Secondary | ICD-10-CM

## 2021-05-07 DIAGNOSIS — R102 Pelvic and perineal pain: Secondary | ICD-10-CM

## 2021-05-07 DIAGNOSIS — O26892 Other specified pregnancy related conditions, second trimester: Secondary | ICD-10-CM

## 2021-05-07 DIAGNOSIS — Z3A24 24 weeks gestation of pregnancy: Secondary | ICD-10-CM

## 2021-05-07 NOTE — Progress Notes (Signed)
HIGH-RISK PREGNANCY OFFICE VISIT Patient name: Diana Rodgers MRN [id number]  Date of birth: 1986-10-09 Chief Complaint:   Routine Prenatal Visit  History of Present Illness:   Diana Rodgers is a 35 y.o. G55P2002 female at [redacted]w[redacted]d with an Estimated Date of Delivery: 08/22/21 being seen today for ongoing management of a high-risk pregnancy complicated by preterm cervical dilation Today she reports some white discharge, but not as thick. She is using yeast cream currently. She was seen in MAU last week for DFM; "everything was fine." She received a bill for $330 for U/S and wants to know why her insurance didn't cover it. She has not picked up the maternity support belt that was Rx'd for her. She states she called the place where we told her to get the belt from said they don't file with Medicaid any longer. Contractions: Not present.  .  Movement: Present. denies leaking of fluid.  Review of Systems:   Pertinent items are noted in HPI Denies abnormal vaginal discharge w/ itching/odor/irritation, headaches, visual changes, shortness of breath, chest pain, abdominal pain, severe nausea/vomiting, or problems with urination or bowel movements unless otherwise stated above. Pertinent History Reviewed:  Reviewed past medical,surgical, social, obstetrical and family history.  Reviewed problem list, medications and allergies. Physical Assessment:   Vitals:   05/07/21 1043 05/07/21 1055  BP: 104/70 104/70  Pulse: 76 76  Temp:  98 F (36.7 C)  Weight: 167 lb (75.8 kg) 167 lb (75.8 kg)  Body mass index is 30.54 kg/m.           Physical Examination:   General appearance: alert, well appearing, and in no distress and oriented to person, place, and time  Mental status: alert, oriented to person, place, and time  Skin: warm & dry   Extremities: Edema: None    Cardiovascular: normal heart rate noted  Respiratory: normal respiratory effort, no distress  Abdomen: gravid, soft, non-tender  Pelvic:  Cervical exam deferred         Fetal Status: Fetal Heart Rate (bpm): 152 Fundal Height: 25 cm Movement: Present    Fetal Surveillance Testing today: none   No results found for this or any previous visit (from the past 24 hour(s)).  Assessment & Plan:  1) High-risk pregnancy G3P2002 at [redacted]w[redacted]d with an Estimated Date of Delivery: 08/22/21   2) Supervision of high risk pregnancy, antepartum - Having good FM since MAU visit - Advised to call [facility] billing department to inquire about U/S bill; can call Medicaid also - Anticipatory guidance for 2 hr GTT - advised to fast after midnight without anything to eat or drink (except for water), will have fasting blood drawn, drink the glucola drink (flavor choices: orange, fruit punch or lemon-lime), have a visit with a provider during the first hour of testing, wait in the lab waiting room to have blood drawn at 1 hour and then 2 hours after finishing glucola drink.   3) Pelvic pressure in pregnancy, antepartum, second trimester - Advised that some pelvic pressure is normal - Advised to purchase maternity support belt from [employer] or retail store  4) [redacted] weeks gestation of pregnancy   Meds: No orders of the defined types were placed in this encounter.   Labs/procedures today: none  Treatment Plan:    Reviewed: Preterm labor symptoms and general obstetric precautions including but not limited to vaginal bleeding, contractions, leaking of fluid and fetal movement were reviewed in detail with the patient.  All questions were answered. Has home bp  cuff. Check bp weekly, let us know if >140/90.   Follow-up: Return in about 4 weeks (around 06/04/2021) for Return OB 2hr GTT.  No orders of the defined types were placed in this encounter.  Raelyn Mora MSN, CNM 05/07/2021 10:45 AM

## 2021-05-07 NOTE — Patient Instructions (Signed)
Oral Glucose Tolerance Test During Pregnancy Why am I having this test? The oral glucose tolerance test (OGTT) is done to check how your body processes blood sugar (glucose). This is one of several tests used to diagnose diabetes that develops during pregnancy (gestational diabetes mellitus). Gestational diabetes is a short-term form of diabetes that some women develop while they are pregnant. It usually occurs during the second trimester of pregnancy and goes away after delivery. Testing, or screening, for gestational diabetes usually occurs at weeks 24-28 of pregnancy. You may have the OGTT test after having a 1-hour glucose screening test if the results from that test indicate that you may have gestational diabetes. This test may also be needed if:  You have a history of gestational diabetes.  There is a history of giving birth to very large babies or of losing pregnancies (having stillbirths).  You have signs and symptoms of diabetes, such as: ? Changes in your eyesight. ? Tingling or numbness in your hands or feet. ? Changes in hunger, thirst, and urination, and these are not explained by your pregnancy. What is being tested? This test measures the amount of glucose in your blood at different times during a period of 3 hours. This shows how well your body can process glucose. What kind of sample is taken? Blood samples are required for this test. They are usually collected by inserting a needle into a blood vessel.   How do I prepare for this test?  For 3 days before your test, eat normally. Have plenty of carbohydrate-rich foods.  Follow instructions from your health care provider about: ? Eating or drinking restrictions on the day of the test. You may be asked not to eat or drink anything other than water (to fast) starting 8-10 hours before the test. ? Changing or stopping your regular medicines. Some medicines may interfere with this test. Tell a health care provider about:  All  medicines you are taking, including vitamins, herbs, eye drops, creams, and over-the-counter medicines.  Any blood disorders you have.  Any surgeries you have had.  Any medical conditions you have. What happens during the test? First, your blood glucose will be measured. This is referred to as your fasting blood glucose because you fasted before the test. Then, you will drink a glucose solution that contains a certain amount of glucose. Your blood glucose will be measured again 1, 2, and 3 hours after you drink the solution. This test takes about 3 hours to complete. You will need to stay at the testing location during this time. During the testing period:  Do not eat or drink anything other than the glucose solution.  Do not exercise.  Do not use any products that contain nicotine or tobacco, such as cigarettes, e-cigarettes, and chewing tobacco. These can affect your test results. If you need help quitting, ask your health care provider. The testing procedure may vary among health care providers and hospitals. How are the results reported? Your results will be reported as milligrams of glucose per deciliter of blood (mg/dL) or millimoles per liter (mmol/L). There is more than one source for screening and diagnosis reference values used to diagnose gestational diabetes. Your health care provider will compare your results to normal values that were established after testing a large group of people (reference values). Reference values may vary among labs and hospitals. For this test ([facility]arp[facility]-Coustan), reference values are:  Fasting: 95 mg/dL (5.3 mmol/L).  1 hour: 180 mg/dL (10.0 mmol/L).  2 hour:   155 mg/dL (8.6 mmol/L).  3 hour: 140 mg/dL (7.8 mmol/L). What do the results mean? Results below the reference values are considered normal. If two or more of your blood glucose levels are at or above the reference values, you may be diagnosed with gestational diabetes. If only one level is  high, your health care provider may suggest repeat testing or other tests to confirm a diagnosis. Talk with your health care provider about what your results mean. Questions to ask your health care provider Ask your health care provider, or the department that is doing the test:  When will my results be ready?  How will I get my results?  What are my treatment options?  What other tests do I need?  What are my next steps? Summary  The oral glucose tolerance test (OGTT) is one of several tests used to diagnose diabetes that develops during pregnancy (gestational diabetes mellitus). Gestational diabetes is a short-term form of diabetes that some women develop while they are pregnant.  You may have the OGTT test after having a 1-hour glucose screening test if the results from that test show that you may have gestational diabetes. You may also have this test if you have any symptoms or risk factors for this type of diabetes.  Talk with your health care provider about what your results mean. This information is not intended to replace advice given to you by your health care provider. Make sure you discuss any questions you have with your health care provider. Document Revised: 05/09/2020 Document Reviewed: 05/09/2020 Elsevier Patient Education  2021 Elsevier Inc.  

## 2021-05-27 ENCOUNTER — Other Ambulatory Visit: Payer: Self-pay

## 2021-05-27 ENCOUNTER — Encounter: Payer: Self-pay | Admitting: *Deleted

## 2021-05-27 ENCOUNTER — Ambulatory Visit: Payer: Medicaid Other | Admitting: *Deleted

## 2021-05-27 ENCOUNTER — Ambulatory Visit: Payer: Medicaid Other | Attending: Obstetrics

## 2021-05-27 VITALS — BP 130/63 | HR 84

## 2021-05-27 DIAGNOSIS — O0930 Supervision of pregnancy with insufficient antenatal care, unspecified trimester: Secondary | ICD-10-CM | POA: Insufficient documentation

## 2021-05-27 DIAGNOSIS — Z3480 Encounter for supervision of other normal pregnancy, unspecified trimester: Secondary | ICD-10-CM | POA: Insufficient documentation

## 2021-06-05 ENCOUNTER — Other Ambulatory Visit (HOSPITAL_COMMUNITY)
Admission: RE | Admit: 2021-06-05 | Discharge: 2021-06-05 | Disposition: A | Discharge status: Home / Self Care | Payer: Medicaid Other | Source: Ambulatory Visit | Attending: Obstetrics and Gynecology | Admitting: Obstetrics and Gynecology

## 2021-06-05 ENCOUNTER — Ambulatory Visit (INDEPENDENT_AMBULATORY_CARE_PROVIDER_SITE_OTHER): Payer: Medicaid Other | Admitting: Obstetrics and Gynecology

## 2021-06-05 ENCOUNTER — Other Ambulatory Visit: Payer: Self-pay

## 2021-06-05 VITALS — BP 110/61 | HR 86 | Temp 98.1°F | Wt 170.4 lb

## 2021-06-05 DIAGNOSIS — N898 Other specified noninflammatory disorders of vagina: Secondary | ICD-10-CM | POA: Diagnosis present

## 2021-06-05 DIAGNOSIS — O0930 Supervision of pregnancy with insufficient antenatal care, unspecified trimester: Secondary | ICD-10-CM | POA: Diagnosis not present

## 2021-06-05 DIAGNOSIS — Z3A28 28 weeks gestation of pregnancy: Secondary | ICD-10-CM | POA: Insufficient documentation

## 2021-06-05 DIAGNOSIS — O0932 Supervision of pregnancy with insufficient antenatal care, second trimester: Secondary | ICD-10-CM

## 2021-06-05 DIAGNOSIS — O99891 Other specified diseases and conditions complicating pregnancy: Secondary | ICD-10-CM

## 2021-06-05 DIAGNOSIS — Z3480 Encounter for supervision of other normal pregnancy, unspecified trimester: Secondary | ICD-10-CM | POA: Diagnosis not present

## 2021-06-05 DIAGNOSIS — M549 Dorsalgia, unspecified: Secondary | ICD-10-CM

## 2021-06-05 DIAGNOSIS — O26899 Other specified pregnancy related conditions, unspecified trimester: Secondary | ICD-10-CM | POA: Insufficient documentation

## 2021-06-05 MED ORDER — CYCLOBENZAPRINE HCL 10 MG PO TABS: 10.0000 mg | ORAL_TABLET | Freq: Two times a day (BID) | ORAL | 0 refills | Status: DC | PRN

## 2021-06-05 NOTE — Progress Notes (Signed)
LOW-RISK PREGNANCY OFFICE VISIT Patient name: Diana Rodgers MRN [id number]  Date of birth: [birth year] Chief Complaint:   Routine Prenatal Visit  History of Present Illness:   Jazzmen Restivo is a 35 y.o. G60P2002 female at [redacted]w[redacted]d with an Estimated Date of Delivery: 08/22/21 being seen today for ongoing management of a low-risk pregnancy.  Today she reports occasional contractions and vaginal irritation. She reports she had to leave work early yesterday, because her stomach was tight and cramping. She went home, took Flexeril and took a nap. She reports that once she woke up the cramping and tightening had stopped. Contractions: Irritability. Vag. Bleeding: None.  Movement: Present. denies leaking of fluid. Review of Systems:   Pertinent items are noted in HPI Denies abnormal vaginal discharge w/ itching/odor/irritation, headaches, visual changes, shortness of breath, chest pain, abdominal pain, severe nausea/vomiting, or problems with urination or bowel movements unless otherwise stated above. Pertinent History Reviewed:  Reviewed past medical,surgical, social, obstetrical and family history.  Reviewed problem list, medications and allergies. Physical Assessment:   Vitals:   06/05/21 0821  BP: 110/61  Pulse: 86  Temp: 98.1 F (36.7 C)  Weight: 170 lb 6.4 oz (77.3 kg)  Body mass index is 31.17 kg/m.        Physical Examination:   General appearance: Well appearing, and in no distress  Mental status: Alert, oriented to person, place, and time  Skin: Warm & dry  Cardiovascular: Normal heart rate noted  Respiratory: Normal respiratory effort, no distress  Abdomen: Soft, gravid, nontender  Pelvic:  deferred - wet prep collected by self-swab          Extremities: Edema: None  Fetal Status: Fetal Heart Rate (bpm): 140 Fundal Height: 30 cm Movement: Present    No results found for this or any previous visit (from the past 24 hour(s)).  Assessment & Plan:  1) Low-risk pregnancy  G3P2002 at [redacted]w[redacted]d with an Estimated Date of Delivery: 08/22/21   2) Supervision of other normal pregnancy, antepartum  - Cervicovaginal ancillary only( [facility]),  - HIV Antibody (routine testing w rflx),  - RPR,  - CBC,  - Glucose Tolerance, 2 Hours w/1 Hour, - No TdaP today  - Unsure of birth control method  3) Vaginal discharge during pregnancy, antepartum  - Cervicovaginal ancillary only( [facility] [facility])  4) Vaginal itching  - Cervicovaginal ancillary only( )  5) [redacted] weeks gestation of pregnancy   6) Back pain complicating pregnancy, third trimester  - Refill Rx for cyclobenzaprine (FLEXERIL) 10 MG tablet    Meds:  Meds ordered this encounter  Medications   cyclobenzaprine (FLEXERIL) 10 MG tablet    Sig: Take 1 tablet (10 mg total) by mouth 2 (two) times daily as needed for muscle spasms.    Dispense:  20 tablet    Refill:  0   Labs/procedures today: 2 hr GTT, 3rd trimester labs  Plan:  Continue routine obstetrical care   Reviewed: Preterm labor symptoms and general obstetric precautions including but not limited to vaginal bleeding, contractions, leaking of fluid and fetal movement were reviewed in detail with the patient.  All questions were answered. Has home bp cuff. Check bp weekly, let us know if >140/90.   Follow-up: Return in about 4 weeks (around 07/03/2021) for Return OB - My Chart video.  Orders Placed This Encounter  Procedures   HIV Antibody (routine testing w rflx)   RPR   CBC   Glucose Tolerance, 2 Hours w/1 Hour  Raelyn Mora MSN, CNM 06/05/2021 8:42 AM

## 2021-06-06 LAB — CBC
Hematocrit: 38.6 % (ref 34.0–46.6)
Hemoglobin: 13 g/dL (ref 11.1–15.9)
MCH: 31.5 pg (ref 26.6–33.0)
MCHC: 33.7 g/dL (ref 31.5–35.7)
MCV: 94 fL (ref 79–97)
Platelets: 289 10*3/uL (ref 150–450)
RBC: 4.13 x10E6/uL (ref 3.77–5.28)
RDW: 12.2 % (ref 11.7–15.4)
WBC: 11.1 10*3/uL — ABNORMAL HIGH (ref 3.4–10.8)

## 2021-06-06 LAB — CERVICOVAGINAL ANCILLARY ONLY
Bacterial Vaginitis (gardnerella): NEGATIVE
Candida Glabrata: NEGATIVE
Candida Vaginitis: POSITIVE — AB
Chlamydia: NEGATIVE
Comment: NEGATIVE
Comment: NEGATIVE
Comment: NEGATIVE
Comment: NEGATIVE
Comment: NEGATIVE
Comment: NORMAL
Neisseria Gonorrhea: NEGATIVE
Trichomonas: NEGATIVE

## 2021-06-06 LAB — RPR: RPR Ser Ql: NONREACTIVE

## 2021-06-06 LAB — GLUCOSE TOLERANCE, 2 HOURS W/ 1HR
Glucose, 1 hour: 172 mg/dL (ref 65–179)
Glucose, 2 hour: 182 mg/dL — ABNORMAL HIGH (ref 65–152)
Glucose, Fasting: 86 mg/dL (ref 65–91)

## 2021-06-06 LAB — HIV ANTIBODY (ROUTINE TESTING W REFLEX): HIV Screen 4th Generation wRfx: NONREACTIVE

## 2021-06-09 ENCOUNTER — Other Ambulatory Visit: Payer: Self-pay | Admitting: Obstetrics and Gynecology

## 2021-06-09 DIAGNOSIS — O24410 Gestational diabetes mellitus in pregnancy, diet controlled: Secondary | ICD-10-CM

## 2021-06-09 MED ORDER — ACCU-CHEK SOFTCLIX LANCETS MISC
12 refills | Status: DC
Start: 2021-06-09 — End: 2021-08-12

## 2021-06-09 MED ORDER — ACCU-CHEK GUIDE W/DEVICE KIT
1.0000 | PACK | Freq: Four times a day (QID) | 0 refills | Status: DC
Start: 2021-06-09 — End: 2021-08-12

## 2021-06-09 NOTE — Progress Notes (Signed)
Notified by MyChart

## 2021-06-11 ENCOUNTER — Other Ambulatory Visit: Payer: Self-pay | Admitting: *Deleted

## 2021-06-11 DIAGNOSIS — Z3480 Encounter for supervision of other normal pregnancy, unspecified trimester: Secondary | ICD-10-CM

## 2021-06-11 DIAGNOSIS — O24410 Gestational diabetes mellitus in pregnancy, diet controlled: Secondary | ICD-10-CM

## 2021-06-11 MED ORDER — ACCU-CHEK GUIDE VI STRP: ORAL_STRIP | 12 refills | Status: DC

## 2021-06-11 NOTE — Progress Notes (Signed)
Patient called stating she did not get test strips. Test strips sent to the pharmacy.  Clovis Pu, RN

## 2021-06-19 ENCOUNTER — Other Ambulatory Visit: Payer: Medicaid Other

## 2021-06-24 ENCOUNTER — Ambulatory Visit: Payer: Medicaid Other | Admitting: Registered"

## 2021-06-24 ENCOUNTER — Other Ambulatory Visit: Payer: Self-pay

## 2021-06-24 ENCOUNTER — Encounter: Payer: Medicaid Other | Attending: Obstetrics and Gynecology | Admitting: Registered"

## 2021-06-24 DIAGNOSIS — O24410 Gestational diabetes mellitus in pregnancy, diet controlled: Secondary | ICD-10-CM | POA: Diagnosis present

## 2021-06-24 DIAGNOSIS — O24419 Gestational diabetes mellitus in pregnancy, unspecified control: Secondary | ICD-10-CM | POA: Insufficient documentation

## 2021-06-24 NOTE — Progress Notes (Signed)
Patient was seen for Gestational Diabetes self-management on 06/24/21  Start time 1130 and End time 1230   Estimated due date: 08/22/21 ; [redacted]w[redacted]d  Clinical: Medications: prenatal, folate, vitamin D Medical History: no previous GDM Labs: OGTT 2 hr elevated, A1c 5.3%   Dietary and Lifestyle History: Patient states both parents have T2DM. Patient states she was surprised by diagnosis because she stopped drinking soda and has been eating pretty well. Pt states she eats vegetables 3x/week. Pt states she does not like steak, chicken, fish.  Pt reports she doesn't like water, feels she might be getting dehydrated.   Physical Activity: not much. Gets tired easily, swelling, pressure from pregnancy Stress: has stress at work [employer]) will only be working 2 more weeks Sleep: not assessed  24 hr Recall: 5:30 am raisin bran, 1% milk & goes back to sleep First Meal: 1 egg, toast, 4 oz orange juice Snack: small amount of fruit Second meal: left overs  Snack: Third meal: lentil soup with tomatoes Snack: Beverages: 3-4 bottles "on a good day" orange juice, coffee 1 c in the morning  NUTRITION INTERVENTION  Nutrition education (E-1) on the following topics:   Initial Follow-up  [x]  []  Definition of Gestational Diabetes [x]  []  Why dietary management is important in controlling blood glucose [x]  []  Effects each nutrient has on blood glucose levels []  []  Simple carbohydrates vs complex carbohydrates [x]  []  Fluid intake [x]  []  Creating a balanced meal plan [x]  []  Carbohydrate counting  [x]  []  When to check blood glucose levels [x]  []  Proper blood glucose monitoring techniques [x]  []  Effect of stress and stress reduction techniques  [x]  []  Exercise effect on blood glucose levels, appropriate exercise during pregnancy [x]  []  Importance of limiting caffeine and abstaining from alcohol and smoking []  []  Medications used for blood sugar control during pregnancy [x]  []  Hypoglycemia and rule of  15 [x]  []  Postpartum self care  Patient already has a meter, is not testing, needs instruction. FBS: n/a Postprandial: n/a CBG: 122 mg/dL   Patient instructed to monitor glucose levels: FBS: 60 - ? 95 mg/dL (some clinics use 90 for cutoff) 1 hour: ? 140 mg/dL 2 hour: ? mg/dL  Patient received handouts: Nutrition Diabetes and Pregnancy Carbohydrate Counting List  Patient will be seen for follow-up as needed.

## 2021-06-30 ENCOUNTER — Other Ambulatory Visit: Payer: Self-pay

## 2021-07-03 ENCOUNTER — Telehealth (INDEPENDENT_AMBULATORY_CARE_PROVIDER_SITE_OTHER): Payer: Medicaid Other | Admitting: Obstetrics and Gynecology

## 2021-07-03 ENCOUNTER — Encounter: Payer: Self-pay | Admitting: Obstetrics and Gynecology

## 2021-07-03 ENCOUNTER — Telehealth: Payer: Medicaid Other | Admitting: Obstetrics and Gynecology

## 2021-07-03 ENCOUNTER — Other Ambulatory Visit: Payer: Self-pay

## 2021-07-03 DIAGNOSIS — O0930 Supervision of pregnancy with insufficient antenatal care, unspecified trimester: Secondary | ICD-10-CM

## 2021-07-03 DIAGNOSIS — L292 Pruritus vulvae: Secondary | ICD-10-CM

## 2021-07-03 DIAGNOSIS — O26893 Other specified pregnancy related conditions, third trimester: Secondary | ICD-10-CM

## 2021-07-03 DIAGNOSIS — O99713 Diseases of the skin and subcutaneous tissue complicating pregnancy, third trimester: Secondary | ICD-10-CM

## 2021-07-03 DIAGNOSIS — O0933 Supervision of pregnancy with insufficient antenatal care, third trimester: Secondary | ICD-10-CM

## 2021-07-03 DIAGNOSIS — Z3A32 32 weeks gestation of pregnancy: Secondary | ICD-10-CM

## 2021-07-03 DIAGNOSIS — O24410 Gestational diabetes mellitus in pregnancy, diet controlled: Secondary | ICD-10-CM

## 2021-07-03 DIAGNOSIS — N898 Other specified noninflammatory disorders of vagina: Secondary | ICD-10-CM

## 2021-07-03 DIAGNOSIS — Z3480 Encounter for supervision of other normal pregnancy, unspecified trimester: Secondary | ICD-10-CM

## 2021-07-03 MED ORDER — FLUCONAZOLE 150 MG PO TABS: 150.0000 mg | ORAL_TABLET | ORAL | 0 refills | Status: DC

## 2021-07-03 NOTE — Progress Notes (Signed)
MY CHART VIDEO VIRTUAL OBSTETRICS VISIT ENCOUNTER NOTE  I connected with Diana Rodgers on 07/03/21 at  2:30 PM EDT by My Chart video at home and verified that I am speaking with the correct person using two identifiers. Provider located at [employer] for [employer] at [place].   I discussed the limitations, risks, security and privacy concerns of performing an evaluation and management service by My Chart video and the availability of in person appointments. I also discussed with the patient that there may be a patient responsible charge related to this service. The patient expressed understanding and agreed to proceed.  I discussed the limitations of telemedicine and the availability of in person appointments.  Discussed there is a possibility of technology failure and discussed alternative modes of communication if that failure occurs.  Subjective:  Diana Rodgers is a 35 y.o. G3P2002 at [redacted]w[redacted]d being followed for ongoing prenatal care.  She is currently monitored for the following issues for this low-risk pregnancy and has Supervision of other normal pregnancy, antepartum; Late prenatal care affecting pregnancy, antepartum; and Gestational diabetes mellitus (GDM), antepartum on their problem list.  Patient reports vaginal irritation and increased pelvic pressure while working . Reports fetal movement. Denies any contractions, bleeding or leaking of fluid.   The following portions of the patient's history were reviewed and updated as appropriate: allergies, current medications, past family history, past medical history, past social history, past surgical history and problem list.   Objective:   General:  Alert, oriented and cooperative.   Mental Status: Normal mood and affect perceived. Normal judgment and thought content.  Rest of physical exam deferred due to type of encounter  There were no vitals taken for this visit. **Patient   Assessment and Plan:  Pregnancy: G3P2002  at [redacted]w[redacted]d  1. Supervision of other normal pregnancy, antepartum - Anticipatory guidance for GBS screening at 36 wks. Explained the test is important to be done at this time in pregnancy to ensure adequate treatment at the time of delivery. Explained that a positive result does not mean any harm to her, but can be harmful to the baby. Meaning that if baby is exposed to the bacteria for too long without antibiotics, the bay has the potential to develop pneumonia, septicemia, or spinal meningitis and could end up in the NICU. Also, explained that a cervical exam may be performed at the time of testing to get a baseline cervical check and make sure there is no preterm cervical dilation.  2. Vaginal discharge during pregnancy in third trimester  - Rx for fluconazole (DIFLUCAN) 150 MG tablet 1 tablet every 3 days x 3 doses  3. Vaginal itching - Rx for fluconazole (DIFLUCAN) 150 MG tablet 1 tablet every 3 days x 3 doses  4. [redacted] weeks gestation of pregnancy  5. Diet controlled gestational diabetes mellitus (GDM) in third trimester  - She cannot fing BS log, she thinks one of her children lost it, but she has the BS values from today - Review of Blood Sugar Levels from today: FBS = 94 mg/dL  2 hr PP range = 116 after breakfast and 115 after lunch - Encouraged to keep a log of BS and have with her for EVERY visit  Preterm labor symptoms and general obstetric precautions including but not limited to vaginal bleeding, contractions, leaking of fluid and fetal movement were reviewed in detail with the patient.  I discussed the assessment and treatment plan with the patient. The patient was provided an opportunity to  ask questions and all were answered. The patient agreed with the plan and demonstrated an understanding of the instructions. The patient was advised to call back or seek an in-person office evaluation/go to MAU at [facility] for any urgent or concerning symptoms. Please refer to  After Visit Summary for other counseling recommendations.   I provided 10 minutes of non-face-to-face time during this encounter. There was 5 minutes of chart review time spent prior to this encounter. Total time spent = 15 minutes.  Return in about 4 weeks (around 07/31/2021) for Return OB w/GBS.  Future Appointments  Date Time Provider Department Center  07/08/2021  8:15 AM [facility]  07/30/2021  1:30 PM Judeth Horn, NP [facility] None  08/06/2021  3:50 PM Bernerd Limbo, CNM [facility] None  08/14/2021  3:20 PM Raelyn Mora, CNM [facility] None  08/21/2021  3:20 PM Raelyn Mora, CNM [facility] None  08/27/2021  3:40 PM Raelyn Mora, CNM [facility] None    Raelyn Mora, CNM Center for [employer], [facility]

## 2021-07-08 ENCOUNTER — Other Ambulatory Visit: Payer: Medicaid Other

## 2021-07-30 ENCOUNTER — Other Ambulatory Visit: Payer: Self-pay

## 2021-07-30 ENCOUNTER — Ambulatory Visit (INDEPENDENT_AMBULATORY_CARE_PROVIDER_SITE_OTHER): Payer: Medicaid Other | Admitting: Student

## 2021-07-30 ENCOUNTER — Other Ambulatory Visit (HOSPITAL_COMMUNITY)
Admission: RE | Admit: 2021-07-30 | Discharge: 2021-07-30 | Disposition: A | Discharge status: Home / Self Care | Payer: Medicaid Other | Source: Ambulatory Visit | Attending: Student | Admitting: Student

## 2021-07-30 VITALS — BP 122/74 | HR 87 | Temp 98.3°F | Wt 181.0 lb

## 2021-07-30 DIAGNOSIS — Z3A36 36 weeks gestation of pregnancy: Secondary | ICD-10-CM | POA: Diagnosis not present

## 2021-07-30 DIAGNOSIS — O24410 Gestational diabetes mellitus in pregnancy, diet controlled: Secondary | ICD-10-CM

## 2021-07-30 DIAGNOSIS — N898 Other specified noninflammatory disorders of vagina: Secondary | ICD-10-CM | POA: Diagnosis present

## 2021-07-30 DIAGNOSIS — B373 Candidiasis of vulva and vagina: Secondary | ICD-10-CM

## 2021-07-30 DIAGNOSIS — O26899 Other specified pregnancy related conditions, unspecified trimester: Secondary | ICD-10-CM

## 2021-07-30 DIAGNOSIS — Z3480 Encounter for supervision of other normal pregnancy, unspecified trimester: Secondary | ICD-10-CM | POA: Diagnosis present

## 2021-07-30 DIAGNOSIS — B3731 Acute candidiasis of vulva and vagina: Secondary | ICD-10-CM

## 2021-07-30 DIAGNOSIS — Z23 Encounter for immunization: Secondary | ICD-10-CM

## 2021-07-30 LAB — OB RESULTS CONSOLE GC/CHLAMYDIA: Gonorrhea: NEGATIVE

## 2021-07-30 MED ORDER — TERCONAZOLE 0.4 % VA CREA: 1.0000 | TOPICAL_CREAM | Freq: Every day | VAGINAL | 0 refills | Status: DC

## 2021-07-30 NOTE — Progress Notes (Signed)
   PRENATAL VISIT NOTE  Subjective:  Diana Rodgers is a 35 y.o. G3P2002 at [redacted]w[redacted]d being seen today for ongoing prenatal care.  She is currently monitored for the following issues for this high-risk pregnancy and has Supervision of other normal pregnancy, antepartum; Late prenatal care affecting pregnancy, antepartum; and Gestational diabetes mellitus (GDM), antepartum on their problem list.  Patient reports vaginal discharge & irritation. Reports she has had recurrent vaginal yeast infections during the pregnancy. Will take medication for it & it returns. Stopped using perfumed soaps. .  Contractions: Irregular. Vag. Bleeding: None.  Movement: Present. Denies leaking of fluid.   The following portions of the patient's history were reviewed and updated as appropriate: allergies, current medications, past family history, past medical history, past social history, past surgical history and problem list.   Objective:   Vitals:   07/30/21 1348  BP: 122/74  Pulse: 87  Temp: 98.3 F (36.8 C)  Weight: 181 lb (82.1 kg)    Fetal Status: Fetal Heart Rate (bpm): 144 Fundal Height: 36 cm Movement: Present     General:  Alert, oriented and cooperative. Patient is in no acute distress.  Skin: Skin is warm and dry. No rash noted.   Cardiovascular: Normal heart rate noted  Respiratory: Normal respiratory effort, no problems with respiration noted  Abdomen: Soft, gravid, appropriate for gestational age.  Pain/Pressure: Present     Pelvic: Cervical exam deferred        Extremities: Normal range of motion.  Edema: None  Mental Status: Normal mood and affect. Normal behavior. Normal judgment and thought content.   Assessment and Plan:  Pregnancy: G3P2002 at [redacted]w[redacted]d 1. Supervision of other normal pregnancy, antepartum  - Cervicovaginal ancillary only( [place]) - Culture, beta strep (group b only) - Tdap vaccine greater than or equal to 7yo IM  2. Diet controlled gestational diabetes mellitus  (GDM), antepartum -did not have log with her b/c she came straight from work. Will take picture & send to [place] via mychart. States her 1 hour post prandials have been in the 140s & her fasting was 92. Discussed importance of keeping track & [place] seeing it to determine if medication is needed.  -needs 36 wk growth ultrasound - ordered - US MFM OB FOLLOW UP; Future  3. Vaginal discharge during pregnancy, antepartum  - Cervicovaginal ancillary only( [place])  4. [redacted] weeks gestation of pregnancy   5. Vaginal yeast infection  - terconazole (TERAZOL 7) 0.4 % vaginal cream; Place 1 applicator vaginally at bedtime. Use for seven days  Dispense: 45 g; Refill: 0  6. Need for tetanus, diphtheria, and acellular pertussis (Tdap) vaccine in patient of adolescent age or older  - Tdap vaccine greater than or equal to 7yo IM  Term labor symptoms and general obstetric precautions including but not limited to vaginal bleeding, contractions, leaking of fluid and fetal movement were reviewed in detail with the patient. Please refer to After Visit Summary for other counseling recommendations.   No follow-ups on file.  Future Appointments  Date Time Provider Department Center  08/06/2021  3:50 PM Bernerd Limbo, [place] [facility] None  08/11/2021  3:30 PM [facility] NURSE [facility]  08/11/2021  3:45 PM [facility] US1 [facility]  08/14/2021  3:20 PM Raelyn Mora, CNM [facility] None  08/21/2021  3:20 PM Raelyn Mora, CNM [facility] None  08/27/2021  3:40 PM Raelyn Mora, CNM [facility] None    Judeth Horn, NP

## 2021-07-31 LAB — CERVICOVAGINAL ANCILLARY ONLY
Bacterial Vaginitis (gardnerella): NEGATIVE
Candida Glabrata: NEGATIVE
Candida Vaginitis: POSITIVE — AB
Chlamydia: NEGATIVE
Comment: NEGATIVE
Comment: NEGATIVE
Comment: NEGATIVE
Comment: NEGATIVE
Comment: NEGATIVE
Comment: NORMAL
Neisseria Gonorrhea: NEGATIVE
Trichomonas: NEGATIVE

## 2021-08-03 LAB — CULTURE, BETA STREP (GROUP B ONLY): Strep Gp B Culture: NEGATIVE

## 2021-08-06 ENCOUNTER — Inpatient Hospital Stay (HOSPITAL_COMMUNITY)
Admission: AD | Admit: 2021-08-06 | Discharge: 2021-08-06 | Disposition: A | Discharge status: Home / Self Care | Payer: Medicaid Other | Source: Home / Self Care | Attending: Obstetrics & Gynecology | Admitting: Obstetrics & Gynecology

## 2021-08-06 ENCOUNTER — Other Ambulatory Visit: Payer: Self-pay

## 2021-08-06 ENCOUNTER — Ambulatory Visit (INDEPENDENT_AMBULATORY_CARE_PROVIDER_SITE_OTHER): Payer: Medicaid Other | Admitting: Certified Nurse Midwife

## 2021-08-06 ENCOUNTER — Encounter (HOSPITAL_COMMUNITY): Payer: Self-pay | Admitting: Obstetrics & Gynecology

## 2021-08-06 VITALS — BP 131/76 | HR 84 | Temp 98.0°F | Wt 182.2 lb

## 2021-08-06 DIAGNOSIS — Z3689 Encounter for other specified antenatal screening: Secondary | ICD-10-CM | POA: Insufficient documentation

## 2021-08-06 DIAGNOSIS — O24415 Gestational diabetes mellitus in pregnancy, controlled by oral hypoglycemic drugs: Secondary | ICD-10-CM

## 2021-08-06 DIAGNOSIS — O24113 Pre-existing diabetes mellitus, type 2, in pregnancy, third trimester: Secondary | ICD-10-CM | POA: Insufficient documentation

## 2021-08-06 DIAGNOSIS — Z3A37 37 weeks gestation of pregnancy: Secondary | ICD-10-CM

## 2021-08-06 DIAGNOSIS — O368130 Decreased fetal movements, third trimester, not applicable or unspecified: Secondary | ICD-10-CM

## 2021-08-06 DIAGNOSIS — Z3493 Encounter for supervision of normal pregnancy, unspecified, third trimester: Secondary | ICD-10-CM

## 2021-08-06 DIAGNOSIS — O24419 Gestational diabetes mellitus in pregnancy, unspecified control: Secondary | ICD-10-CM

## 2021-08-06 HISTORY — DX: Type 2 diabetes mellitus without complications: E11.9

## 2021-08-06 LAB — URINALYSIS, ROUTINE W REFLEX MICROSCOPIC
Bilirubin Urine: NEGATIVE
Glucose, UA: NEGATIVE mg/dL
Ketones, ur: 5 mg/dL — AB
Nitrite: NEGATIVE
Protein, ur: NEGATIVE mg/dL
RBC / HPF: >50 RBC/hpf — ABNORMAL HIGH (ref 0–5)
Specific Gravity, Urine: 1.028 (ref 1.005–1.030)
pH: 5 (ref 5.0–8.0)

## 2021-08-06 NOTE — MAU Provider Note (Signed)
History     CSN: [id number]  Arrival date and time: 08/06/21 2004   Event Date/Time   First Provider Initiated Contact with Patient 08/06/21 2101      Chief Complaint  Patient presents with   Decreased Fetal Movement   HPI Diana Rodgers is a 35 y.o. G3P2002 at 10w5dwho [facility] periods of minimal variability during office NST.   Patient also complains of decreased fetal movement since 08/05/2021.  She ate a peanut butter and jelly sandwich on the way to MAU and on arrival endorses active fetal movement. She denies contractions,  vaginal bleeding, leaking of fluid, fever, falls, or recent illness.   Patient's pregnancy is c/b A1GDM.  OB History     Gravida  3   Para  2   Term  2   Preterm      AB      Living  2      SAB      IAB      Ectopic      Multiple      Live Births  2           Past Medical History:  Diagnosis Date   Di[place]    Medical history non-contributory     Past Surgical History:  Procedure Laterality Date   APPENDECTOMY      History reviewed. No pertinent family history.  Social History   Tobacco Use   Smoking status: Never   Smokeless tobacco: Never  Vaping Use   Vaping Use: Never used  Substance Use Topics   Alcohol use: Not Currently   Drug use: Never    Allergies: No Known Allergies  Medications Prior to Admission  Medication Sig Dispense Refill Last Dose   fluconazole (DIFLUCAN) 150 MG tablet Take 1 tablet (150 mg total) by mouth every 3 (three) days. 3 tablet 0 Past Month   folic acid (FOLVITE) 4875MCG tablet Take 400 mcg by mouth daily.   08/06/2021   Prenatal Vit-Fe Fumarate-FA (PRENATAL MULTIVITAMIN) TABS tablet Take 1 tablet by mouth daily at 12 noon.   08/06/2021   terconazole (TERAZOL 7) 0.4 % vaginal cream Place 1 applicator vaginally at bedtime. Use for seven days 45 g 0 08/06/2021   Accu-Chek Softclix Lancets lancets Use  as instructed 100 each 12    Blood Glucose Monitoring Suppl (ACCU-CHEK GUIDE) w/Device KIT 1 each by Does not apply route 4 (four) times daily. 1 kit 0    cyclobenzaprine (FLEXERIL) 10 MG tablet Take 1 tablet (10 mg total) by mouth 2 (two) times daily as needed for muscle spasms. 20 tablet 0 More than a month   Elastic Bandages & Supports (COMFORT FIT MATERNITY SUPP SM) MISC 1 Units by Does not apply route daily as needed. 1 each 0    glucose blood (ACCU-CHEK GUIDE) test strip Use as instructed test blood glucose 4 times daily 100 each 12     Review of Systems  Gastrointestinal:  Negative for abdominal pain.  Genitourinary:  Negative for vaginal bleeding.  All other systems reviewed and are negative. Physical Exam   Blood pressure 125/70, pulse 98, temperature 97.9 F (36.6 C), resp. rate 18, height _0  (1.575 m), weight 82.6 kg.  Physical Exam Vitals and nursing note reviewed. Exam conducted with a chaperone present.  Constitutional:      General: She is not in acute distress.    Appearance: Normal appearance. She  is obese. She is not ill-appearing.  Cardiovascular:     Rate and Rhythm: Normal rate.     Pulses: Normal pulses.  Pulmonary:     Effort: Pulmonary effort is normal.  Abdominal:     Comments: Gravid  Skin:    Capillary Refill: Capillary refill takes less than 2 seconds.  Neurological:     Mental Status: She is alert and oriented to person, place, and time.  Psychiatric:        Mood and Affect: Mood normal.        Behavior: Behavior normal.        Thought Content: Thought content normal.        Judgment: Judgment normal.    MAU Course  Procedures  --Office note identifies patient as A2GDM. Pt confirmed she is NOT on medication for GDM but it has been mentioned as a possibility in the future --Only checking 2 hour postprandial CBGs. 2-3 units within goal  --Cervix checked per patient request: 1-1.5/thick/-3, vertex by suture --Reactive tracing x 1 hour  prolonged monitoring --Baseline 145, mod var, + accels, no decels --Toco: occasional contractions, pain score 0/10, patient reporting ongoing pressure --Patient endorsing fetal movement on arrival to MAU. Pushed NST button 8 times during one hour of monitoring --Urine contaminated by scant blood from cervical exam --Patient declines recheck of cervix prior to discharge  Patient Vitals for the past 24 hrs:  BP Temp Pulse Resp SpO2 Height Weight  08/06/21 2154 119/73 -- 90 -- 99 % -- --  08/06/21 2028 125/70 97.9 F (36.6 C) 98 18 -- _0  (1.575 m) 82.6 kg   Results for orders placed or performed during the hospital encounter of 08/06/21 (from the past 24 hour(s))  Urinalysis, Routine w reflex microscopic Urine, Clean Catch     Status: Abnormal   Collection Time: 08/06/21  8:31 PM  Result Value Ref Range   Color, Urine YELLOW YELLOW   APPearance HAZY (A) CLEAR   Specific Gravity, Urine 1.028 1.005 - 1.030   pH 5.0 5.0 - 8.0   Glucose, UA NEGATIVE NEGATIVE mg/dL   Hgb urine dipstick SMALL (A) NEGATIVE   Bilirubin Urine NEGATIVE NEGATIVE   Ketones, ur 5 (A) NEGATIVE mg/dL   Protein, ur NEGATIVE NEGATIVE mg/dL   Nitrite NEGATIVE NEGATIVE   Leukocytes,Ua SMALL (A) NEGATIVE   RBC / HPF >50 (H) 0 - 5 RBC/hpf   WBC, UA 11-20 0 - 5 WBC/hpf   Bacteria, UA FEW (A) NONE SEEN   Squamous Epithelial / LPF 11-20 0 - 5   Mucus PRESENT    Assessment and Plan  --35 y.o. G3P2002 at [redacted]w[redacted]d --Reactive tracing x 1 hour prolonged monitoring --1-1.5cm, declines recheck of cervix --A1GDM, no indication for induction this evening --Discharge home in stable condition  SDarlina Rumpf CNM 08/06/2021, 10:03 PM

## 2021-08-06 NOTE — Progress Notes (Addendum)
   PRENATAL VISIT NOTE  Subjective:  Diana Rodgers is a 35 y.o. G3P2002 at [redacted]w[redacted]d being seen today for ongoing prenatal care.  She is currently monitored for the following issues for this low-risk pregnancy and has Supervision of other normal pregnancy, antepartum; Late prenatal care affecting pregnancy, antepartum; and Gestational diabetes mellitus (GDM), antepartum on their problem list.  Patient reports  decreased fetal movement since yesterday. Uploaded a picture of her recorded glucose readings (not from an app, just a pic of her Notes app) .  Contractions: Not present. Vag. Bleeding: None.  Movement: (!) Decreased. Denies leaking of fluid.   The following portions of the patient's history were reviewed and updated as appropriate: allergies, current medications, past family history, past medical history, past social history, past surgical history and problem list.   Objective:   Vitals:   08/06/21 1618  BP: 131/76  Pulse: 84  Temp: 98 F (36.7 C)  Weight: 182 lb 3.2 oz (82.6 kg)    Fetal Status: Fetal Heart Rate (bpm): NST Fundal Height: 37 cm Movement: (!) Decreased    Fetal Tracing: reassuring but not reactive Baseline: 140 Variability: minimal overall Accelerations: one 15x15, a few 10x10s Decelerations: none Toco: UI  General:  Alert, oriented and cooperative. Patient is in no acute distress.  Skin: Skin is warm and dry. No rash noted.   Cardiovascular: Normal heart rate noted  Respiratory: Normal respiratory effort, no problems with respiration noted  Abdomen: Soft, gravid, appropriate for gestational age.  Pain/Pressure: Present     Pelvic: Cervical exam deferred - while performing Leopold's, noted that baby responded once to my palpation with a slow movement of an extremity and then stopped responding. Pt noted this is abnormal for the baby but has been her behavior since yesterday.  Extremities: Normal range of motion.  Edema: None  Mental Status: Normal mood and  affect. Normal behavior. Normal judgment and thought content.   Assessment and Plan:  Pregnancy: G3P2002 at [redacted]w[redacted]d Supervision of low risk pregnancy, third trimester - Doing well but has noted baby is not moving per her usual since yesterday  [redacted] weeks gestation of pregnancy - Routine OB care  Gestational diabetes, diet controlled - Review of uploaded readings shows 3-4 fastings above range, most are AT range 93-95. Post-prandials are mostly 120-130. - Given pt's late PNC, borderline glucose control, and now decreased fetal movement, suggest IOL at 38wks if not admitted this evening.  Decreased fetal movement in the third trimester - NST minimally reactive, pt sent to MAU for extended monitoring and possible BPP. - report called to Venia Carbon, NP - Discussed plan with pt who is amenable and plans to drop kids at home and head in.  Bernerd Limbo, CNM

## 2021-08-06 NOTE — MAU Note (Signed)
Pt reports she had her prenatal appointment. Reports she has been feeling increased pelvic pressure today and told them baby not moving as much as usual. Was monitored in office and then told to come to [facility] for more monitoring.  Denies any vag bleeding or leaking

## 2021-08-07 ENCOUNTER — Inpatient Hospital Stay (HOSPITAL_COMMUNITY)
Admission: AD | Admit: 2021-08-07 | Discharge: 2021-08-12 | DRG: 806 | Disposition: A | Discharge status: Home / Self Care | Payer: Medicaid Other | Attending: Obstetrics and Gynecology | Admitting: Obstetrics and Gynecology

## 2021-08-07 ENCOUNTER — Other Ambulatory Visit: Payer: Self-pay | Admitting: Obstetrics and Gynecology

## 2021-08-07 DIAGNOSIS — O9081 Anemia of the puerperium: Secondary | ICD-10-CM | POA: Diagnosis not present

## 2021-08-07 DIAGNOSIS — O99893 Other specified diseases and conditions complicating puerperium: Secondary | ICD-10-CM | POA: Diagnosis not present

## 2021-08-07 DIAGNOSIS — O0930 Supervision of pregnancy with insufficient antenatal care, unspecified trimester: Secondary | ICD-10-CM

## 2021-08-07 DIAGNOSIS — R29898 Other symptoms and signs involving the musculoskeletal system: Secondary | ICD-10-CM | POA: Diagnosis not present

## 2021-08-07 DIAGNOSIS — D62 Acute posthemorrhagic anemia: Secondary | ICD-10-CM | POA: Diagnosis not present

## 2021-08-07 DIAGNOSIS — Z20822 Contact with and (suspected) exposure to covid-19: Secondary | ICD-10-CM | POA: Diagnosis present

## 2021-08-07 DIAGNOSIS — M21371 Foot drop, right foot: Secondary | ICD-10-CM | POA: Diagnosis not present

## 2021-08-07 DIAGNOSIS — R292 Abnormal reflex: Secondary | ICD-10-CM | POA: Diagnosis not present

## 2021-08-07 DIAGNOSIS — Z3A37 37 weeks gestation of pregnancy: Secondary | ICD-10-CM

## 2021-08-07 DIAGNOSIS — Z8632 Personal history of gestational diabetes: Secondary | ICD-10-CM

## 2021-08-07 DIAGNOSIS — R531 Weakness: Secondary | ICD-10-CM | POA: Diagnosis not present

## 2021-08-07 DIAGNOSIS — O24419 Gestational diabetes mellitus in pregnancy, unspecified control: Secondary | ICD-10-CM

## 2021-08-07 DIAGNOSIS — O24420 Gestational diabetes mellitus in childbirth, diet controlled: Secondary | ICD-10-CM | POA: Diagnosis present

## 2021-08-07 LAB — GLUCOSE, CAPILLARY: Glucose-Capillary: 103 mg/dL — ABNORMAL HIGH (ref 70–99)

## 2021-08-07 LAB — CBC
HCT: 36.6 % (ref 36.0–46.0)
Hemoglobin: 12.4 g/dL (ref 12.0–15.0)
MCH: 29.6 pg (ref 26.0–34.0)
MCHC: 33.9 g/dL (ref 30.0–36.0)
MCV: 87.4 fL (ref 80.0–100.0)
Platelets: 300 10*3/uL (ref 150–400)
RBC: 4.19 MIL/uL (ref 3.87–5.11)
RDW: 13.2 % (ref 11.5–15.5)
WBC: 11.3 10*3/uL — ABNORMAL HIGH (ref 4.0–10.5)
nRBC: 0 % (ref 0.0–0.2)

## 2021-08-07 MED ORDER — ONDANSETRON HCL 4 MG/2ML IJ SOLN
4.0000 mg | Freq: Four times a day (QID) | INTRAMUSCULAR | Status: DC | PRN
  Administered 2021-08-08: 4 mg via INTRAVENOUS
  Filled 2021-08-07: qty 2

## 2021-08-07 MED ORDER — LIDOCAINE HCL (PF) 1 % IJ SOLN: 30.0000 mL | INTRAMUSCULAR | Status: DC | PRN

## 2021-08-07 MED ORDER — SOD CITRATE-CITRIC ACID 500-334 MG/5ML PO SOLN: 30.0000 mL | ORAL | Status: DC | PRN

## 2021-08-07 MED ORDER — FENTANYL CITRATE (PF) 100 MCG/2ML IJ SOLN
50.0000 ug | INTRAMUSCULAR | Status: DC | PRN
  Administered 2021-08-07: 50 ug via INTRAVENOUS
  Filled 2021-08-07: qty 2

## 2021-08-07 MED ORDER — OXYCODONE-ACETAMINOPHEN 5-325 MG PO TABS: 2.0000 | ORAL_TABLET | ORAL | Status: DC | PRN

## 2021-08-07 MED ORDER — MISOPROSTOL 50MCG HALF TABLET
50.0000 ug | ORAL_TABLET | ORAL | Status: DC
  Administered 2021-08-07: 50 ug via ORAL
  Filled 2021-08-07: qty 1

## 2021-08-07 MED ORDER — OXYCODONE-ACETAMINOPHEN 5-325 MG PO TABS: 1.0000 | ORAL_TABLET | ORAL | Status: DC | PRN

## 2021-08-07 MED ORDER — LACTATED RINGERS IV SOLN
500.0000 mL | INTRAVENOUS | Status: DC | PRN
  Administered 2021-08-08: 500 mL via INTRAVENOUS

## 2021-08-07 MED ORDER — ACETAMINOPHEN 325 MG PO TABS
650.0000 mg | ORAL_TABLET | ORAL | Status: DC | PRN
  Administered 2021-08-08: 650 mg via ORAL
  Filled 2021-08-07: qty 2

## 2021-08-07 MED ORDER — OXYTOCIN BOLUS FROM INFUSION
333.0000 mL | Freq: Once | INTRAVENOUS | Status: AC
  Administered 2021-08-08: 333 mL via INTRAVENOUS

## 2021-08-07 MED ORDER — LACTATED RINGERS IV SOLN: INTRAVENOUS | Status: DC

## 2021-08-07 MED ORDER — FENTANYL CITRATE (PF) 100 MCG/2ML IJ SOLN
INTRAMUSCULAR | Status: AC
  Filled 2021-08-07: qty 2

## 2021-08-07 NOTE — H&P (Signed)
OBSTETRIC ADMISSION HISTORY AND PHYSICAL  Eastyn Skalla is a 35 y.o. female G66P2002 with IUP at 16w6dby early [place] for IOL due to poorly controlled GDM. She reports No LOF, no blurry vision, headaches or peripheral edema, and RUQ pain. Did have some scant vaginal bleeding after cervical check yesterday that has resolved. She plans on breast feeding. She is undecided for birth control. Reported DFM for the past few days, however feeling + fetal movement currently.  [facility]    Dating: By U/S --->  Estimated Date of Delivery: 08/22/21  Sono:    _0 , CWD, normal anatomy, cephalic presentation, 10301T 31% EFW   Prenatal History/Complications:  --Poorly controlled GDM, did not often check CBGs and out of appropriate range, did not start medication --Late to care at 22 weeks  --Rubella non-immune   Past Medical History: Past Medical History:  Diagnosis Date   Di[place]    Medical history non-contributory     Past Surgical History: Past Surgical History:  Procedure Laterality Date   APPENDECTOMY      Obstetrical History: OB History     Gravida  3   Para  2   Term  2   Preterm      AB      Living  2      SAB      IAB      Ectopic      Multiple      Live Births  2           Social History Social History   Socioeconomic History   Marital status: Married    Spouse name: Not on file   Number of children: 2   Years of education: Not on file   Highest education level: High school graduate  Occupational History   Occupation: CScientist, water quality Tobacco Use   Smoking status: Never   Smokeless tobacco: Never  Vaping Use   Vaping Use: Never used  Substance and Sexual Activity   Alcohol use: Not Currently   Drug use: Never   Sexual activity: Yes    Birth control/protection: None  Other Topics Concern   Not on file  Social History Narrative   Not on file   Social Determinants  of Health   Financial Resource Strain: Not on file  Food Insecurity: Not on file  Transportation Needs: Not on file  Physical Activity: Not on file  Stress: Not on file  Social Connections: Not on file    Family History: No family history on file.  Allergies: No Known Allergies  Medications Prior to Admission  Medication Sig Dispense Refill Last Dose   Accu-Chek Softclix Lancets lancets Use as instructed 100 each 12    Blood Glucose Monitoring Suppl (ACCU-CHEK GUIDE) w/Device KIT 1 each by Does not apply route 4 (four) times daily. 1 kit 0    cyclobenzaprine (FLEXERIL) 10 MG tablet Take 1 tablet (10 mg total) by mouth 2 (two) times daily as needed for muscle spasms. 20 tablet 0    Elastic Bandages & Supports (COMFORT FIT MATERNITY SUPP SM) MISC 1 Units by Does not apply route daily as needed. 1 each 0    folic acid (FOLVITE) 4143MCG tablet Take 400 mcg by mouth daily.      glucose blood (ACCU-CHEK GUIDE) test strip Use as instructed test blood glucose 4 times daily 100 each 12    Prenatal Vit-Fe Fumarate-FA (PRENATAL MULTIVITAMIN) TABS  tablet Take 1 tablet by mouth daily at 12 noon.      terconazole (TERAZOL 7) 0.4 % vaginal cream Place 1 applicator vaginally at bedtime. Use for seven days 45 g 0      Review of Systems   All systems reviewed and negative except as stated in HPI  Blood pressure 128/76, pulse 98, temperature 98.6 F (37 C), temperature source Oral. General appearance: alert, cooperative, and no distress Lungs: clear to auscultation bilaterally Heart: regular rate and rhythm Abdomen: soft, non-tender; bowel sounds normal Pelvic: NEFG Extremities: Homans sign is negative, no sign of DVT Presentation: cephalic Fetal monitoringBaseline: 145 bpm, Variability: Good {> 6 bpm), Accelerations: Reactive, and Decelerations: Absent Uterine activityNone Dilation: 2 Effacement (%): 40 Station: -2 Exam by:: Higinio Plan MD   Prenatal labs: ABO, Rh: --/--/A POS (08/25  2159) Antibody: NEG (08/25 2159) Rubella: 0.90 (04/26 0914) RPR: Non Reactive (06/23 0839)  HBsAg: Negative (04/26 0914)  HIV: Non Reactive (06/23 0839)  GBS: Negative/-- (08/17 1427)  1 hr Glucola abnormal  Genetic screening low risk  Anatomy US normal   Prenatal Transfer Tool  Maternal Diabetes: Yes:  Diabetes Type:  Diet controlled Genetic Screening: Normal Maternal Ultrasounds/Referrals: Normal Fetal Ultrasounds or other Referrals:  None Maternal Substance Abuse:  No Significant Maternal Medications:  None Significant Maternal Lab Results: Group B Strep negative  Results for orders placed or performed during the hospital encounter of 08/07/21 (from the past 24 hour(s))  CBC   Collection Time: 08/07/21  9:59 PM  Result Value Ref Range   WBC 11.3 (H) 4.0 - 10.5 K/uL   RBC 4.19 3.87 - 5.11 MIL/uL   Hemoglobin 12.4 12.0 - 15.0 g/dL   HCT 36.6 36.0 - 46.0 %   MCV 87.4 80.0 - 100.0 fL   MCH 29.6 26.0 - 34.0 pg   MCHC 33.9 30.0 - 36.0 g/dL   RDW 13.2 11.5 - 15.5 %   Platelets 300 150 - 400 K/uL   nRBC 0.0 0.0 - 0.2 %  Type and screen   Collection Time: 08/07/21  9:59 PM  Result Value Ref Range   ABO/RH(D) A POS    Antibody Screen NEG    Sample Expiration      08/10/2021,2359 Performed at [facility], [address] [place], [place] [address]     Patient Active Problem List   Diagnosis Date Noted   GDM, class A2 08/07/2021   Gestational diabetes mellitus (GDM), antepartum 06/24/2021   Supervision of other normal pregnancy, antepartum 04/08/2021   Late prenatal care affecting pregnancy, antepartum 04/08/2021    Assessment/Plan:  Davon Folta is a 35 y.o. G3P2002 at 32w6dhere for IOL due to poorly controlled GDM.   #Labor: After verbal consent and IV fent, placed FB. Patient tolerated procedure well, some mild bloody show with placement. Will monitor closely. Buccal cytotec x1 given.  #Pain: PRN, considering epidural  #FWB: Cat 1  #ID: GBS neg  #MOF:  Breastfeeding  #MOC: Undecided  #Circ: N/A   #GDM: Poor adherence with elevated postprandial CBGs (usually 130-140's), has not been on any medication. Considering as an A2GDM with MFM recommending IOL due to poor control. Monitor q 4.   #Rubella non-immune: MMR pp.   SPatriciaann Clan DO  08/07/2021, 11:11 PM

## 2021-08-07 NOTE — Progress Notes (Signed)
Patient was sent from the office yesterday d/t decreased fetal movement @ [redacted]w[redacted]d. She is A2 GDM as she has only uploaded 1 list of blood sugars and has only been checking her BS 1-2 times per day. She is not on DM medication. She was late to care, and has not been regularly seen in the office. Her last MFM [place] was in June. She had a reactive NST in MAU and was sent home. After review of the chart with Dr. Judeth Cornfield it is recommended that she is delivered given that she is now [redacted]w[redacted]d and given the risk of fetal demise associated with GDM.   Spoke to Dr. Alysia Penna on labor who is agreeable with plan. Spoke to [employer] who is aware patient is coming.  Called patient this morning to discuss care, she was tearful on the phone however agreeable to come in. She is in [place] now and will plan to be at [facility] around 5/6:00 pm tonight.    Labor charge nurse Trent informed, and orders placed.  Venia Carbon, NP

## 2021-08-08 ENCOUNTER — Inpatient Hospital Stay (HOSPITAL_COMMUNITY): Payer: Medicaid Other | Admitting: Anesthesiology

## 2021-08-08 ENCOUNTER — Other Ambulatory Visit: Payer: Self-pay

## 2021-08-08 ENCOUNTER — Encounter (HOSPITAL_COMMUNITY): Payer: Self-pay | Admitting: Obstetrics and Gynecology

## 2021-08-08 DIAGNOSIS — O24420 Gestational diabetes mellitus in childbirth, diet controlled: Secondary | ICD-10-CM

## 2021-08-08 DIAGNOSIS — Z3A37 37 weeks gestation of pregnancy: Secondary | ICD-10-CM

## 2021-08-08 LAB — RESP PANEL BY RT-PCR (FLU A&B, COVID) ARPGX2
Influenza A by PCR: NEGATIVE
Influenza B by PCR: NEGATIVE
SARS Coronavirus 2 by RT PCR: NEGATIVE

## 2021-08-08 LAB — CBC
HCT: 36.7 % (ref 36.0–46.0)
Hemoglobin: 11.8 g/dL — ABNORMAL LOW (ref 12.0–15.0)
MCH: 29.1 pg (ref 26.0–34.0)
MCHC: 32.2 g/dL (ref 30.0–36.0)
MCV: 90.4 fL (ref 80.0–100.0)
Platelets: 281 10*3/uL (ref 150–400)
RBC: 4.06 MIL/uL (ref 3.87–5.11)
RDW: 13.4 % (ref 11.5–15.5)
WBC: 21.7 10*3/uL — ABNORMAL HIGH (ref 4.0–10.5)
nRBC: 0 % (ref 0.0–0.2)

## 2021-08-08 LAB — GLUCOSE, CAPILLARY
Glucose-Capillary: 79 mg/dL (ref 70–99)
Glucose-Capillary: 91 mg/dL (ref 70–99)
Glucose-Capillary: 87 mg/dL (ref 70–99)
Glucose-Capillary: 106 mg/dL — ABNORMAL HIGH (ref 70–99)

## 2021-08-08 LAB — FIBRINOGEN: Fibrinogen: 361 mg/dL (ref 210–475)

## 2021-08-08 LAB — RPR: RPR Ser Ql: NONREACTIVE

## 2021-08-08 LAB — APTT: aPTT: 21 seconds — ABNORMAL LOW (ref 24–36)

## 2021-08-08 MED ORDER — COCONUT OIL OIL: 1.0000 "application " | TOPICAL_OIL | Status: DC | PRN

## 2021-08-08 MED ORDER — CEFAZOLIN SODIUM-DEXTROSE 2-4 GM/100ML-% IV SOLN
2.0000 g | Freq: Once | INTRAVENOUS | Status: AC
  Administered 2021-08-08: 2 g via INTRAVENOUS
  Filled 2021-08-08: qty 100

## 2021-08-08 MED ORDER — MISOPROSTOL 200 MCG PO TABS
ORAL_TABLET | ORAL | Status: AC
  Administered 2021-08-08: 800 ug via RECTAL
  Filled 2021-08-08: qty 4

## 2021-08-08 MED ORDER — SIMETHICONE 80 MG PO CHEW: 80.0000 mg | CHEWABLE_TABLET | ORAL | Status: DC | PRN

## 2021-08-08 MED ORDER — TETANUS-DIPHTH-ACELL PERTUSSIS 5-2.5-18.5 LF-MCG/0.5 IM SUSY: 0.5000 mL | PREFILLED_SYRINGE | Freq: Once | INTRAMUSCULAR | Status: DC

## 2021-08-08 MED ORDER — SENNOSIDES-DOCUSATE SODIUM 8.6-50 MG PO TABS
2.0000 | ORAL_TABLET | Freq: Every day | ORAL | Status: DC
  Administered 2021-08-09 – 2021-08-12 (×4): 2 via ORAL
  Filled 2021-08-08 (×4): qty 2

## 2021-08-08 MED ORDER — IBUPROFEN 600 MG PO TABS
600.0000 mg | ORAL_TABLET | Freq: Four times a day (QID) | ORAL | Status: DC
  Administered 2021-08-08 – 2021-08-12 (×15): 600 mg via ORAL
  Filled 2021-08-08 (×15): qty 1

## 2021-08-08 MED ORDER — MEASLES, MUMPS & RUBELLA VAC IJ SOLR: 0.5000 mL | Freq: Once | INTRAMUSCULAR | Status: DC

## 2021-08-08 MED ORDER — ONDANSETRON HCL 4 MG/2ML IJ SOLN: 4.0000 mg | INTRAMUSCULAR | Status: DC | PRN

## 2021-08-08 MED ORDER — LIDOCAINE HCL (PF) 1 % IJ SOLN
INTRAMUSCULAR | Status: DC | PRN
  Administered 2021-08-08 (×2): 4 mL via EPIDURAL

## 2021-08-08 MED ORDER — METHYLERGONOVINE MALEATE 0.2 MG/ML IJ SOLN
INTRAMUSCULAR | Status: AC
  Filled 2021-08-08: qty 1

## 2021-08-08 MED ORDER — TRANEXAMIC ACID-NACL 1000-0.7 MG/100ML-% IV SOLN
1000.0000 mg | INTRAVENOUS | Status: AC
  Administered 2021-08-08: 1000 mg via INTRAVENOUS

## 2021-08-08 MED ORDER — OXYTOCIN-SODIUM CHLORIDE 30-0.9 UT/500ML-% IV SOLN
1.0000 m[IU]/min | INTRAVENOUS | Status: DC
  Filled 2021-08-08: qty 500

## 2021-08-08 MED ORDER — DIPHENHYDRAMINE HCL 25 MG PO CAPS: 25.0000 mg | ORAL_CAPSULE | Freq: Four times a day (QID) | ORAL | Status: DC | PRN

## 2021-08-08 MED ORDER — ZOLPIDEM TARTRATE 5 MG PO TABS: 5.0000 mg | ORAL_TABLET | Freq: Every evening | ORAL | Status: DC | PRN

## 2021-08-08 MED ORDER — FENTANYL-BUPIVACAINE-NACL 0.5-0.125-0.9 MG/250ML-% EP SOLN
EPIDURAL | Status: DC | PRN
  Administered 2021-08-08: 12 mL/h via EPIDURAL

## 2021-08-08 MED ORDER — BENZOCAINE-MENTHOL 20-0.5 % EX AERO
1.0000 "application " | INHALATION_SPRAY | CUTANEOUS | Status: DC | PRN
  Filled 2021-08-08: qty 56

## 2021-08-08 MED ORDER — TRANEXAMIC ACID-NACL 1000-0.7 MG/100ML-% IV SOLN
INTRAVENOUS | Status: AC
  Filled 2021-08-08: qty 100

## 2021-08-08 MED ORDER — FENTANYL CITRATE (PF) 100 MCG/2ML IJ SOLN
100.0000 ug | Freq: Once | INTRAMUSCULAR | Status: AC
Start: 2021-08-08 — End: 2021-08-08
  Administered 2021-08-08: 100 ug via INTRAVENOUS

## 2021-08-08 MED ORDER — ONDANSETRON HCL 4 MG PO TABS: 4.0000 mg | ORAL_TABLET | ORAL | Status: DC | PRN

## 2021-08-08 MED ORDER — ACETAMINOPHEN 325 MG PO TABS
650.0000 mg | ORAL_TABLET | ORAL | Status: DC | PRN
  Administered 2021-08-08 – 2021-08-09 (×2): 650 mg via ORAL
  Filled 2021-08-08 (×2): qty 2

## 2021-08-08 MED ORDER — MISOPROSTOL 200 MCG PO TABS: 800.0000 ug | ORAL_TABLET | Freq: Once | ORAL | Status: AC

## 2021-08-08 MED ORDER — LACTATED RINGERS IV SOLN
500.0000 mL | Freq: Once | INTRAVENOUS | Status: AC
  Administered 2021-08-08: 500 mL via INTRAVENOUS

## 2021-08-08 MED ORDER — METHYLERGONOVINE MALEATE 0.2 MG/ML IJ SOLN
0.2000 mg | Freq: Once | INTRAMUSCULAR | Status: AC
  Administered 2021-08-08: 0.2 mg via INTRAMUSCULAR

## 2021-08-08 MED ORDER — FENTANYL-BUPIVACAINE-NACL 0.5-0.125-0.9 MG/250ML-% EP SOLN
EPIDURAL | Status: AC
  Filled 2021-08-08: qty 250

## 2021-08-08 MED ORDER — DIBUCAINE (PERIANAL) 1 % EX OINT: 1.0000 "application " | TOPICAL_OINTMENT | CUTANEOUS | Status: DC | PRN

## 2021-08-08 MED ORDER — OXYCODONE HCL 5 MG PO TABS
5.0000 mg | ORAL_TABLET | ORAL | Status: DC | PRN
  Administered 2021-08-08: 5 mg via ORAL
  Administered 2021-08-09 (×3): 10 mg via ORAL
  Administered 2021-08-10: 5 mg via ORAL
  Filled 2021-08-08: qty 2
  Filled 2021-08-08: qty 1
  Filled 2021-08-08 (×2): qty 2
  Filled 2021-08-08: qty 1

## 2021-08-08 MED ORDER — WITCH HAZEL-GLYCERIN EX PADS: 1.0000 "application " | MEDICATED_PAD | CUTANEOUS | Status: DC | PRN

## 2021-08-08 MED ORDER — PRENATAL MULTIVITAMIN CH
1.0000 | ORAL_TABLET | Freq: Every day | ORAL | Status: DC
  Administered 2021-08-09 – 2021-08-12 (×4): 1 via ORAL
  Filled 2021-08-08 (×4): qty 1

## 2021-08-08 NOTE — Anesthesia Preprocedure Evaluation (Signed)
Anesthesia Evaluation  Patient identified by MRN, date of birth, ID band Patient awake    Reviewed: Allergy & Precautions, H&P , NPO status , Patient's Chart, lab work & pertinent test results  Airway Mallampati: II   Neck ROM: full    Dental   Pulmonary neg pulmonary ROS,    breath sounds clear to auscultation       Cardiovascular negative cardio ROS   Rhythm:regular Rate:Normal     Neuro/Psych    GI/Hepatic   Endo/Other  diabetes, Gestational  Renal/GU      Musculoskeletal   Abdominal   Peds  Hematology   Anesthesia Other Findings   Reproductive/Obstetrics (+) Pregnancy                             Anesthesia Physical Anesthesia Plan  ASA: 2  Anesthesia Plan: Epidural   Post-op Pain Management:    Induction: Intravenous  PONV Risk Score and Plan: 2 and Treatment may vary due to age or medical condition  Airway Management Planned: Natural Airway  Additional Equipment:   Intra-op Plan:   Post-operative Plan:   Informed Consent: I have reviewed the patients History and Physical, chart, labs and discussed the procedure including the risks, benefits and alternatives for the proposed anesthesia with the patient or authorized representative who has indicated his/her understanding and acceptance.     Dental advisory given  Plan Discussed with: Anesthesiologist  Anesthesia Plan Comments:         Anesthesia Quick Evaluation

## 2021-08-08 NOTE — Lactation Note (Signed)
This note was copied from a baby's chart. Lactation Consultation Note  Patient Name: Girl Adelae Yodice HWEXH'B Date: 08/08/2021 Reason for consult: Initial assessment;1st time breastfeeding;Early term 37-38.6wks Age:33 hours   P3 mother whose infant is now 23 hours old.  This is an ETI at 38+0 weeks.  Mother attempted breast feeding with her first two children (now 73 and 57 years old) but stated that the babies would not latch.    LC order entered at 1535.  Father had just finished giving some donor milk per mother's request.  Offered to assist with latching and mother interested.  Baby latched easily to the left breast in the cross cradle hold.  Demonstrated gentle stimulation and breast compressions.  Observed baby feeding for 11 minutes while reviewing breast feeding basics.  Discussed positioning with good pillow support.  Mother pleased to see baby feeding.    She will continue to feed 9=12 times/24 hours or sooner if baby shows feeding cues.  Suggested mother call her RN/LC for latch assistance as needed.  Reassurance provided.  Father present.  RN updated.      Maternal Data Has patient been taught Hand Expression?: Yes Does the patient have breastfeeding experience prior to this delivery?: No (Attempted but not successful with her other children)  Feeding Mother's Current Feeding Choice: Breast Milk and Donor Milk  LATCH Score Latch: Grasps breast easily, tongue down, lips flanged, rhythmical sucking.  Audible Swallowing: A few with stimulation  Type of Nipple: Everted at rest and after stimulation  Comfort (Breast/Nipple): Soft / non-tender  Hold (Positioning): Assistance needed to correctly position infant at breast and maintain latch.  LATCH Score: 8   Lactation Tools Discussed/Used    Interventions Interventions: Breast feeding basics reviewed;Assisted with latch;Skin to skin;Breast massage;Hand express;Breast compression;Adjust position;Position  options;Support pillows;Education  Discharge Pump: Personal (Plans to obtain a DEBP from [facility]) WIC Program: Yes  Consult Status Consult Status: Follow-up Date: 08/09/21 Follow-up type: In-patient    Isebella Upshur R Devaun Flanery 08/08/2021, 4:02 PM

## 2021-08-08 NOTE — Progress Notes (Signed)
Labor Progress Note Annamary Buschman is a 35 y.o. G3P2002 at [redacted]w[redacted]d presented for IOL due to poorly controlled GDM.   S: FB out per RN and rechecked, now 5cm and feeling contractions. Would like epidural.   O:  BP 129/76   Pulse 87   Temp 98.2 F (36.8 C) (Oral)  EFM: 115/mod/15x15/none  Contractions every 2-3 min   CVE: Dilation: 5 Effacement (%): 60 Station: -3 Presentation: Vertex Exam by:: Thomes Cake RN   A&P: 35 y.o. [id number] [redacted]w[redacted]d  #Labor: Progressing well, FB fell out recently and s/p cytotec x1. Will have epidural placed and then start pit 2x2 per patient request to wait until analgesia performed.  #Pain: Epidural planned  #FWB: Cat 1 #GBS negative  #GDM: Most recent CBG 103, will monitor. Unclear EFW, last [place] in 05/2021. CBG q4.   Allayne Stack, DO 2:32 AM

## 2021-08-08 NOTE — Progress Notes (Addendum)
L&D Note  Mel Almond removed and bleeding minimal. Fundus still firm and LUS/suprapubic area firm now vs before at the latter. Continue foley (approximately in bag, clear) and watch for 30-13m and if still no bleeding then okay to transfer to PP.   Patient with a fever and feels warm and has some shakes but she did receive of cytotec and she got a dose of ancef for the [facility] placement. Continue to follow   Patient Vitals for the past 6 hrs:  BP Temp Temp src Pulse Resp SpO2 Height Weight  08/08/21 1245 (!) 143/99 -- -- (!) 112 -- 97 % -- --  08/08/21 1230 133/89 (!) 100.5 F (38.1 C) Axillary (!) 127 19 98 % -- --  08/08/21 1215 128/81 -- -- (!) 111 -- 97 % -- --  08/08/21 1200 136/86 -- -- (!) 120 -- 98 % -- --  08/08/21 1145 123/83 -- -- (!) 143 -- 98 % -- --  08/08/21 1130 (!) 147/85 -- -- (!) 121 -- 99 % -- --  08/08/21 1115 127/76 -- -- (!) 118 -- -- -- --  08/08/21 1100 (!) 116/57 98.2 F (36.8 C) Oral (!) 101 18 -- -- --  08/08/21 1030 124/75 -- -- (!) 110 17 -- -- --  08/08/21 1015 129/74 -- -- 97 -- -- -- --  08/08/21 1000 (!) 110/48 -- -- (!) 107 -- -- -- --  08/08/21 0930 113/72 98.5 F (36.9 C) Oral 100 16 -- -- --  08/08/21 0900 120/68 -- -- (!) 101 -- -- -- --  08/08/21 0838 -- -- -- -- -- -- 5\' 2"  (1.575 m) 82.6 kg  08/08/21 0830 106/62 -- -- (!) 103 -- -- -- --  08/08/21 0800 113/69 -- -- (!) 101 -- -- -- --  08/08/21 0730 126/78 -- -- 93 -- -- -- --   CBC Latest Ref Rng & Units 08/08/2021 08/07/2021 06/05/2021  WBC 4.0 - 10.5 K/uL 21.7(H) 11.3(H) 11.1(H)  Hemoglobin 12.0 - 15.0 g/dL 11.8(L) 12.4 13.0  Hematocrit 36.0 - 46.0 % 36.7 36.6 38.6  Platelets 150 - 400 K/uL 281 300 289    06/07/2021 MD Attending Center for [facility] (Faculty Practice) 08/08/2021 Time: [phone]

## 2021-08-08 NOTE — Discharge Summary (Signed)
Postpartum Discharge Summary     Patient Name: Diana Rodgers DOB: [birth year] MRN: [id number]  Date of admission: 08/07/2021 Delivery date:08/08/2021  Delivering provider: Genia Del  Date of discharge: 08/12/2021  Admitting diagnosis: GDM, class A2 [O24.419] Intrauterine pregnancy: [redacted]w[redacted]d    Secondary diagnosis:  Principal Problem:   Vaginal delivery Active Problems:   Late prenatal care affecting pregnancy, antepartum   GDM, class A2   Weakness of both lower limbs  Additional problems: Acute blood loss anemia    Discharge diagnosis: Term Pregnancy Delivered                                              Post partum procedures: none Augmentation: Cytotec and IP Foley Complications:   Hospital course: Induction of Labor With Vaginal Delivery   35y.o. yo G3P2002 at 372w0das admitted to the hospital 08/07/2021 for induction of labor.  Indication for induction: A2 DM.  Patient had an uncomplicated labor course as follows: Membrane Rupture Time/Date: 4:00 AM ,08/08/2021   Delivery Method:Vaginal, Spontaneous  Episiotomy: None  Lacerations:    Details of delivery can be found in separate delivery note.  On postpartum day two, it was noted that she was requiring two person assist still and was having foot drop. Patient had an MRI without contrast, showing L5/S1 disk protrusion without stenosis. Neurology consulted - the requested MRI with and without contrast, however patient declined this. Patient states that her weakness is improving and requests to leave. Neurology discussed with patient that she should stay so a reason for her symptoms can be found and that leaving now can result in lifelong deficit. Patient insistent upon leaving. Outpatient neuro appointment made. Patient is discharged home 08/12/21.  Newborn Data: Birth date:08/08/2021  Birth time:9:51 AM  Gender:Female  Living status:Living  Apgars:8 ,9  Weight:3209 g   Magnesium Sulfate received: No BMZ  received: No Rhophylac:N/A MMR:Yes - needs postpartum T-DaP:Given prenatally Flu: No Transfusion:No  Physical exam  Vitals:   08/11/21 0500 08/11/21 1351 08/11/21 2030 08/12/21 0500  BP: 108/64 124/74 116/69 111/62  Pulse: 74 76 73 73  Resp: 18 18 16 18   Temp: 98.9 F (37.2 C) 99 F (37.2 C) 98.7 F (37.1 C) 98 F (36.7 C)  TempSrc: Oral Oral Oral Oral  SpO2:      Weight:      Height:       General: alert, cooperative, and no distress Lochia: appropriate Uterine Fundus: firm Incision: N/A DVT Evaluation: No evidence of DVT seen on physical exam. Negative Homan's sign. Labs: Lab Results  Component Value Date   WBC 10.2 08/10/2021   HGB 9.5 (L) 08/10/2021   HCT 29.2 (L) 08/10/2021   MCV 89.0 08/10/2021   PLT 267 08/10/2021   No flowsheet data found. Edinburgh Score: Edinburgh Postnatal Depression Scale Screening Tool 08/10/2021  I have been able to laugh and see the funny side of things. 3  I have looked forward with enjoyment to things. 3  I have blamed myself unnecessarily when things went wrong. 0  I have been anxious or worried for no good reason. 0  I have felt scared or panicky for no good reason. 0  Things have been getting on top of me. 0  I have been so unhappy that I have had difficulty sleeping. 0  I have felt sad  or miserable. 0  I have been so unhappy that I have been crying. 0  The thought of harming myself has occurred to me. 0  Edinburgh Postnatal Depression Scale Total 6     After visit meds:  Allergies as of 08/12/2021   No Known Allergies      Medication List     STOP taking these medications    Accu-Chek Guide test strip Generic drug: glucose blood   Accu-Chek Guide w/Device Kit   Accu-Chek Softclix Lancets lancets   Comfort Fit Maternity Supp Sm Misc   cyclobenzaprine 10 MG tablet Commonly known as: FLEXERIL   folic acid 010 MCG tablet Commonly known as: FOLVITE   terconazole 0.4 % vaginal cream Commonly known as:  TERAZOL 7       TAKE these medications    ibuprofen 600 MG tablet Commonly known as: ADVIL Take 1 tablet (600 mg total) by mouth every 6 (six) hours.   prenatal multivitamin Tabs tablet Take 1 tablet by mouth daily at 12 noon.               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  A walker will not resolve issue with performing activities of daily living. A wheelchair will allow patient to safely perform daily activities. Patient can safely propel the wheelchair in the home or has a caregiver who can provide assistance. Length of need 6 months . Accessories: elevating leg rests (ELRs), wheel locks, extensions and anti-tippers.   08/11/21 1418             Discharge home in stable condition Infant Feeding: Breast Infant Disposition:home with mother Discharge instruction: per After Visit Summary and Postpartum booklet. Activity: Advance as tolerated. Pelvic rest for 6 weeks.  Diet: routine diet Future Appointments: Future Appointments  Date Time Provider [place]  09/05/2021  8:15 AM [place], [place] None   Follow up Visit: Message sent to [facility] by Dr. Gwenlyn Perking on 08/08/21.  Please schedule this patient for a In person postpartum  visit in 4 weeks with the following provider: Any provider. Additional Postpartum F/U: 2 hour GTT  High risk pregnancy complicated by: GDM Delivery mode:  Vaginal, Spontaneous  Anticipated Birth Control:  Unsure   08/12/2021 Truett Mainland, DO

## 2021-08-08 NOTE — Progress Notes (Signed)
Labor Progress Note Diana Rodgers is a 35 y.o. G3P2002 at [redacted]w[redacted]d presented for IOL due to poorly controlled GDM.   S: Feeling contractions, had epidural placed.   O:  BP 119/86   Pulse 97   Temp 98.2 F (36.8 C) (Axillary)   Resp 18   SpO2 99%  EFM: 140/mod/15x15/occasional variable/early   CVE: Dilation: 7.5 Effacement (%): 80 Station: -2 Presentation: Vertex Exam by:: stone rnc   A&P: 35 y.o. [id number] [redacted]w[redacted]d  #Labor: Progressing well with expectant management only for now. S/p cytotec x1 and FB. Cont to monitor.  #Pain: Epidural placed  #FWB: Cat 1 alternating with Cat II, IVF and position changes with good variability  #GBS negative  #GDM: CBG stable. Cont to monitor q2.   Allayne Stack, DO 7:48 AM

## 2021-08-08 NOTE — Progress Notes (Signed)
Patient ID: Jerelyn Trimarco, female   DOB: 1986-09-10, 35 y.o.   MRN: [id number] S: Notified by RN of continued bleeding and passage of clots. Patient feeling lightheaded.  Patient s/p Pit, TXA, methergine, Cytotec. Dr. Vergie Living notified and called to assist with [facility] placement.  O:  Vitals:   08/08/21 1115 08/08/21 1130  BP: 127/76 (!) 147/85  Pulse: (!) 118 (!) 121  Resp:    Temp:    SpO2:  99%   EBL now 975. Fundus firm. Lower uterine segment boggy and with clot on exam.   A&P: Uterine bleeding refractory to medical management. Patient is hemodynamically stable.  - Jada placed, wall suction at 80. Patient received Fentanyl prior to procedure - STAT CBC, Fibrinogen, APTT  - Will follow-up labs and reassess in one hour or sooner if clinical status changes

## 2021-08-08 NOTE — Anesthesia Procedure Notes (Signed)
Epidural Patient location during procedure: OB Start time: 08/08/2021 2:55 AM End time: 08/08/2021 3:08 AM  Staffing Anesthesiologist: Achille Rich, MD Performed: anesthesiologist   Preanesthetic Checklist Completed: patient identified, IV checked, site marked, risks and benefits discussed, monitors and equipment checked, pre-op evaluation and timeout performed  Epidural Patient position: sitting Prep: DuraPrep Patient monitoring: heart rate, cardiac monitor, continuous pulse ox and blood pressure Approach: midline Location: L2-L3 Injection technique: LOR saline  Needle:  Needle type: Tuohy  Needle gauge: 17 G Needle length: 9 cm Needle insertion depth: 6 cm Catheter type: closed end flexible Catheter size: 19 Gauge Catheter at skin depth: 12 cm Test dose: negative and Other  Assessment Events: blood not aspirated, injection not painful, no injection resistance and negative IV test  Additional Notes Informed consent obtained prior to proceeding including risk of failure, 1% risk of PDPH, risk of minor discomfort and bruising.  Discussed rare but serious complications including epidural abscess, permanent nerve injury, epidural hematoma.  Discussed alternatives to epidural analgesia and patient desires to proceed.  Timeout performed pre-procedure verifying patient name, procedure, and platelet count.  Patient tolerated procedure well. Reason for block:procedure for pain

## 2021-08-09 LAB — CBC
HCT: 27.4 % — ABNORMAL LOW (ref 36.0–46.0)
HCT: 26.7 % — ABNORMAL LOW (ref 36.0–46.0)
Hemoglobin: 9.1 g/dL — ABNORMAL LOW (ref 12.0–15.0)
Hemoglobin: 8.7 g/dL — ABNORMAL LOW (ref 12.0–15.0)
MCH: 29.4 pg (ref 26.0–34.0)
MCH: 29 pg (ref 26.0–34.0)
MCHC: 33.2 g/dL (ref 30.0–36.0)
MCHC: 32.6 g/dL (ref 30.0–36.0)
MCV: 88.4 fL (ref 80.0–100.0)
MCV: 89 fL (ref 80.0–100.0)
Platelets: 245 10*3/uL (ref 150–400)
Platelets: 236 10*3/uL (ref 150–400)
RBC: 3.1 MIL/uL — ABNORMAL LOW (ref 3.87–5.11)
RBC: 3 MIL/uL — ABNORMAL LOW (ref 3.87–5.11)
RDW: 13.6 % (ref 11.5–15.5)
RDW: 13.7 % (ref 11.5–15.5)
WBC: 11.9 10*3/uL — ABNORMAL HIGH (ref 4.0–10.5)
WBC: 11.1 10*3/uL — ABNORMAL HIGH (ref 4.0–10.5)
nRBC: 0 % (ref 0.0–0.2)
nRBC: 0 % (ref 0.0–0.2)

## 2021-08-09 LAB — PREPARE RBC (CROSSMATCH)

## 2021-08-09 LAB — GLUCOSE, CAPILLARY: Glucose-Capillary: 70 mg/dL (ref 70–99)

## 2021-08-09 LAB — ABO/RH: ABO/RH(D): A POS

## 2021-08-09 MED ORDER — LACTATED RINGERS IV SOLN: INTRAVENOUS | Status: DC

## 2021-08-09 MED ORDER — LACTATED RINGERS IV BOLUS
1000.0000 mL | Freq: Once | INTRAVENOUS | Status: AC
  Administered 2021-08-09: 1000 mL via INTRAVENOUS

## 2021-08-09 MED ORDER — SODIUM CHLORIDE 0.9% IV SOLUTION: Freq: Once | INTRAVENOUS | Status: DC

## 2021-08-09 MED ORDER — SODIUM CHLORIDE 0.9 % IV SOLN
500.0000 mg | Freq: Once | INTRAVENOUS | Status: AC
  Administered 2021-08-09: 500 mg via INTRAVENOUS
  Filled 2021-08-09: qty 25

## 2021-08-09 NOTE — Clinical Social Work Maternal (Addendum)
CLINICAL SOCIAL WORK MATERNAL/CHILD NOTE  Patient Details  Name: Diana Rodgers MRN: [id number] Date of Birth: 06/26/1986  Date:  08/09/2021  Clinical Social Worker Initiating Note:  Darcus Austin, MSW, LCSWA Date/Time: Initiated:  08/09/21/1159     Child's Name:  undecided   Biological Parents:  Mother, Father   Need for Interpreter:  None   Reason for Referral:  Late or No Prenatal Care     Address:  [address] [place] [address]    Phone number:  [phone] (home)     Additional phone number: [phone] (Jesus Greenwich, FOB)   Household Members/Support Persons (HM/SP):   Household Member/Support Person 1, Household Member/Support Person 2, Household Member/Support Person 3   HM/SP Name Relationship DOB or Age  HM/SP -1 Jesus Montejano FOB 10/08/85  HM/SP -2 Penne Lash Daughter 09/13/10  HM/SP -3 Tana Felts Son 02/22/12  HM/SP -4        HM/SP -5        HM/SP -6        HM/SP -7        HM/SP -8          Natural Supports (not living in the home):  Spouse/significant other   Professional Supports: None   Employment: Full-time   Type of Work:     Education:  Programmer, systems   Homebound arranged:    Museum/gallery curator Resources:  [employer]   Other Resources:  [employer], Physicist, medical     Cultural/Religious Considerations Which May Impact Care:    Strengths:  Ability to meet basic needs  , Home prepared for child     Psychotropic Medications:         Pediatrician:       Pediatrician List:   [place]      Pediatrician Fax Number:    Risk Factors/Current Problems:  Other (Comment) (late prenatal care)   Cognitive State:  Able to Concentrate  , Alert  , Insightful  , Linear Thinking  , Goal Oriented     Mood/Affect:  Comfortable  , Interested  , Calm  , Happy  , Bright  , Relaxed     CSW Assessment: CSW met with [name]  to complete consult for late prenatal care. CSW observed [name] resting in bed, and FOB in recliner bonding with infant. [name] gave CSW verbal consent to complete consult while FOB was present. CSW explained role, and reason for consult. [name] was pleasant, and polite during engagement with CSW. [name] reported, she had insurance difficulties, and that is the reason for late prenatal care. [name] reported, she needed an [place] coverage denial letter, before establishing coverage in [place].  [name] denied any mental health, and psychotropic medications.  CSW provided education regarding the baby blues period vs. perinatal mood disorders, discussed treatment and gave resources for mental health follow up if concerns arise. CSW recommends self- evaluation during the postpartum time period using the New Mom Checklist from Postpartum Progress and encouraged [name] to contact a medical professional if symptoms are noted at any time.   [name] reported, since delivery she feels, "good". [name] reported, FOB is very supportive. [name] denied SI, and HI when CSW assessed for safety.   [name] reported, she receives both [facility], and food stamps. [name] reported, she is unsure where infant's pediatrician will be, but there are no barriers  to follow up infant's care. CSW provided [name] with peds list. [name] reported, she has all essentials needed to care for infant. [name] reported, infant has a car seat, and crib. [name] denied any additional barriers.     CSW provided education on sudden infant death syndrome (SIDS).   CSW identifies no further need for intervention or barriers to discharge at this time.  CSW Plan/Description:  No Further Intervention Required/No Barriers to Discharge, Sudden Infant Death Syndrome (SIDS) Education, Perinatal Mood and Anxiety Disorder (PMADs) Education, CSW Will Continue to Monitor Umbilical Cord Tissue Drug Screen Results and Make Report if Warranted, [place],  [place] 08/09/2021, 12:11 PM  Darcus Austin, MSW, LCSW-A Clinical Social Worker- Weekends [phone]    **Per medical team, consult was entered mistakenly. [name] established care at 22 wks, and had more than 3 visits total during pregnancy.

## 2021-08-09 NOTE — Progress Notes (Signed)
Post Partum Day 1 Subjective: no complaints, tolerating PO, + flatus, and Foley in place after PPH  Objective: Blood pressure (!) 108/57, pulse 78, temperature 98 F (36.7 C), temperature source Oral, resp. rate 18, height 5\' 2"  (1.575 m), weight 82.6 kg, SpO2 97 %, unknown if currently breastfeeding.  Physical Exam:  General: alert, cooperative, and no distress Lochia: appropriate Uterine Fundus: firm Incision: n/a DVT Evaluation: No evidence of DVT seen on physical exam.  Recent Labs    08/08/21 1134 08/09/21 0510  HGB 11.8* 9.1*  HCT 36.7 27.4*    Assessment/Plan: Plan for discharge tomorrow, Breastfeeding, and Contraception undecided .  PT has Foley in place due to [facility], plan to take out to day and pt to walk with RN in room, reevaluate for bleeding and symptoms of anemia.    LOS: 2 days   [facility] 08/09/2021, 9:56 AM

## 2021-08-09 NOTE — Progress Notes (Signed)
Ambulated patient this evening. Patient states she has no dizziness upon ambulation. Patient slightly shaky and states her "legs feel weak." Patient was slow upon ambulation. Patient ambulated to the bathroom, foley catheter was removed, and patient ambulated to chair to sit. Patient still cramping significantly; however, her bleeding is very small. PRN meds provided. Earl Gala, Linda Hedges [place]

## 2021-08-09 NOTE — Plan of Care (Addendum)
  Problem: Activity: Goal: Risk for activity intolerance will decrease Note: Patient called out stating she was dizzy. Upon assessment of patient, patient states she was attempting to adjust herself and sit up in bed and she became "dizzy out of nowhere." BP 93/49 lying. Notified Sharen Counter, who discussed a plan to possibly give IV Venofer and wait to discontinue foley until after Venofer has infused as long as patient can tolerate activity. Misty Stanley to discuss with patient and place orders. Earl Gala, Linda Hedges [place]

## 2021-08-09 NOTE — Progress Notes (Signed)
Pt had Hgb 12.7>9.3>8.7 still dizzy, unsteady on her feet symptomatic after venofer. Will treat with 1U pRBCs, post H&H.

## 2021-08-09 NOTE — Plan of Care (Signed)
  Problem: Activity: Goal: Risk for activity intolerance will decrease 08/09/2021 1655 by Karn Cassis, RN Note: Discussed with patient the importance of increasing activity throughout the day to assist with healing. Offered to assist patient to sit up and possibly attempt to ambulate to the bathroom around lunchtime; however, patient declined stating that she was still dizzy and had had no rest. Offered to assist patient to change her pads; however, patient declined. Upon assessing infant after 1500, patient appeared to be feeling better. Discussed with patient and patient stated she was beginning to feel better. LR bolus was complete and IV Venofer was about halfway infused by this time. Discussed with patient that once Venofer complete or almost complete, staff could attempt to ambulate patient and determine whether foley is safe to remove (per Misty Stanley [place]'s previous discussion with RN). Patient states that she feels like this would be a good plan.  Earl Gala, Linda Hedges [place]

## 2021-08-09 NOTE — Anesthesia Postprocedure Evaluation (Signed)
Anesthesia Post Note  Patient: Diana Rodgers  Procedure(s) Performed: AN AD HOC LABOR EPIDURAL     Patient location during evaluation: Mother Baby Anesthesia Type: Epidural Level of consciousness: awake, oriented and awake and alert Pain management: pain level controlled Vital Signs Assessment: post-procedure vital signs reviewed and stable Respiratory status: spontaneous breathing, respiratory function stable and nonlabored ventilation Cardiovascular status: stable Postop Assessment: no headache, adequate PO intake, able to ambulate, patient able to bend at knees, no backache and no apparent nausea or vomiting Anesthetic complications: no   No notable events documented.  Last Vitals:  Vitals:   08/08/21 2345 08/09/21 0410  BP: 96/60 (!) 108/57  Pulse: 85 78  Resp: 20 18  Temp: 36.9 C 36.7 C  SpO2:      Last Pain:  Vitals:   08/09/21 0457  TempSrc:   PainSc: Asleep   Pain Goal: Patients Stated Pain Goal: 3 (08/08/21 0715)                 Orlyn Odonoghue

## 2021-08-09 NOTE — Progress Notes (Signed)
At the start of my shift I attempted orthos and to get the patient up, however she stated that she had some blurred vision and her legs were still tingling. I did orthos at my second 4 hour and got her up but patient has significant cramping and was still a little unsteady on her feet and needed a lot of assistance to walk to to the bathroom. I told her I would not remove her foley yet and that we would reassess in the morning.

## 2021-08-10 ENCOUNTER — Inpatient Hospital Stay (HOSPITAL_COMMUNITY): Payer: Medicaid Other

## 2021-08-10 LAB — TYPE AND SCREEN
ABO/RH(D): A POS
Antibody Screen: NEGATIVE
Unit division: 0

## 2021-08-10 LAB — CBC
HCT: 29.2 % — ABNORMAL LOW (ref 36.0–46.0)
Hemoglobin: 9.5 g/dL — ABNORMAL LOW (ref 12.0–15.0)
MCH: 29 pg (ref 26.0–34.0)
MCHC: 32.5 g/dL (ref 30.0–36.0)
MCV: 89 fL (ref 80.0–100.0)
Platelets: 267 10*3/uL (ref 150–400)
RBC: 3.28 MIL/uL — ABNORMAL LOW (ref 3.87–5.11)
RDW: 13.7 % (ref 11.5–15.5)
WBC: 10.2 10*3/uL (ref 4.0–10.5)
nRBC: 0 % (ref 0.0–0.2)

## 2021-08-10 LAB — BPAM RBC
Blood Product Expiration Date: 202209172359
ISSUE DATE / TIME: 202208272217
Unit Type and Rh: 6200

## 2021-08-10 NOTE — Evaluation (Signed)
Physical Therapy Evaluation Patient Details Name: Diana Rodgers MRN: [id number] DOB: 11/28/1986 Today's Date: 08/10/2021   History of Present Illness  Pt is a 35 y.o. F  G3P3003 s/p VD at [redacted]w[redacted]d who presents with acute BLE weakness since postpartum s/p epidural during labor associated with hyperreflexia. Pending MRI.  Clinical Impression  Pt presents with decreased functional mobility secondary to gross bilateral lower extremity weakness; grossly 2-/5 on manual muscle testing. Intact to light touch upon assessment, however, pt reports sensation changes. Pt requiring min assist for transfers and ambulating 15 ft with a RW. She is obviously relying heavily through her arms due to significant lower extremity weakness and is limited in the distance she can tolerate going. Would recommend RW for shorter distances and w/c for longer distances currently. Pt has 3 steps to enter home, so will need to provide education to husband to see if he can bump her up in the wheelchair as she would be unable to physically negotiate them currently.     Follow Up Recommendations Home health PT;Supervision for mobility/OOB    Equipment Recommendations  Rolling walker with 5" wheels;3in1 (PT);Wheelchair (measurements PT);Wheelchair cushion (measurements PT)    Recommendations for Other Services       Precautions / Restrictions Precautions Precautions: Fall Restrictions Weight Bearing Restrictions: No      Mobility  Bed Mobility Overal bed mobility: Needs Assistance Bed Mobility: Supine to Sit;Sit to Supine     Supine to sit: Supervision Sit to supine: Min assist   General bed mobility comments: Very effortful, with increased time to manage BLE's off edge of bed. Assist for LE's back into bed    Transfers Overall transfer level: Needs assistance Equipment used: Rolling walker (2 wheeled) Transfers: Sit to/from Stand Sit to Stand: Min assist         General transfer comment: Cues for  foot/hand placement, minA to rise  Ambulation/Gait Ambulation/Gait assistance: Min assist Gait Distance (Feet): 15 Feet Assistive device: Rolling walker (2 wheeled) Gait Pattern/deviations: Step-through pattern;Wide base of support;Decreased stride length;Decreased dorsiflexion - right;Decreased dorsiflexion - left Gait velocity: decreased Gait velocity interpretation: <1.8 ft/sec, indicate of risk for recurrent falls General Gait Details: Erratic foot placement and occasional right foot inversion due to weakness, minA for balance and management of RW. Cues for use of walker and neutral foot positioning in addition to sequencing/direction/stepping initiation  Stairs            Wheelchair Mobility    Modified Rankin (Stroke Patients Only)       Balance Overall balance assessment: Needs assistance Sitting-balance support: Feet supported Sitting balance-Leahy Scale: Good     Standing balance support: Bilateral upper extremity supported Standing balance-Leahy Scale: Poor Standing balance comment: heavily reliant on RW                             Pertinent Vitals/Pain Pain Assessment: No/denies pain    Home Living Family/patient expects to be discharged to:: Private residence Living Arrangements: Spouse/significant other;Children (newborn, 77 and 32 y.o.) Available Help at Discharge: Family Type of Home: House Home Access: Stairs to enter Entrance Stairs-Rails: Doctor, general practice of Steps: 3 Home Layout: One level        Prior Function Level of Independence: Independent               Hand Dominance        Extremity/Trunk Assessment   Upper Extremity Assessment Upper Extremity Assessment: Overall [facility]  for tasks assessed    Lower Extremity Assessment Lower Extremity Assessment: LLE deficits/detail;RLE deficits/detail RLE Deficits / Details: Strength 2-/5 RLE Sensation: WNL LLE Deficits / Details: Strength 2-/5 LLE Sensation:  WNL       Communication   Communication: No difficulties  Cognition Arousal/Alertness: Awake/alert Behavior During Therapy: WFL for tasks assessed/performed Overall Cognitive Status: Within Functional Limits for tasks assessed                                        General Comments      Exercises     Assessment/Plan    PT Assessment Patient needs continued PT services  PT Problem List Decreased strength;Decreased activity tolerance;Decreased balance;Decreased mobility       PT Treatment Interventions DME instruction;Gait training;Functional mobility training;Therapeutic activities;Balance training;Therapeutic exercise;Patient/family education;Wheelchair mobility training    PT Goals (Current goals can be found in the Care Plan section)  Acute Rehab PT Goals Patient Stated Goal: for legs to get stronger PT Goal Formulation: With patient Time For Goal Achievement: 08/24/21 Potential to Achieve Goals: Good    Frequency Min 3X/week   Barriers to discharge        Co-evaluation               AM-PAC PT "6 Clicks" Mobility  Outcome Measure Help needed turning from your back to your side while in a flat bed without using bedrails?: A Little Help needed moving from lying on your back to sitting on the side of a flat bed without using bedrails?: A Little Help needed moving to and from a bed to a chair (including a wheelchair)?: A Little Help needed standing up from a chair using your arms (e.g., wheelchair or bedside chair)?: A Little Help needed to walk in hospital room?: A Little Help needed climbing 3-5 steps with a railing? : A Lot 6 Click Score: 17    End of Session Equipment Utilized During Treatment: Gait belt Activity Tolerance: Patient tolerated treatment well Patient left: in bed;with call bell/phone within reach Nurse Communication: Mobility status PT Visit Diagnosis: Unsteadiness on feet (R26.81);Muscle weakness (generalized)  (M62.81);Difficulty in walking, not elsewhere classified (R26.2)    Time: 1751-0258 PT Time Calculation (min) (ACUTE ONLY): 17 min   Charges:   PT Evaluation $PT Eval Low Complexity: 1 Low          Lillia Pauls, PT, DPT Acute Rehabilitation Services Pager [phone] Office [phone]   Norval Morton 08/10/2021, 4:36 PM

## 2021-08-10 NOTE — Progress Notes (Signed)
POSTPARTUM PROGRESS NOTE  Post Partum Day 2  Subjective:  Marshea Wisher is a 35 y.o. K1S0109 s/p VD at [redacted]w[redacted]d.  She reports she is feeling better in terms of lightheadedness/fatigue, however still feeling unsteady on her feet. RN at bedside reports she has continued to be a 2 person assist for gait and drags her feet. She denies any back or LE pain/numbness or upper extremity weakness. Had epidural with labor. Urinating without difficulty. +Flatus, no BM yet. She denies any problems with ambulating, voiding or po intake. Denies nausea or vomiting.  Pain is well controlled.  Lochia is mild.  Objective: Blood pressure 96/60, pulse 80, temperature 97.8 F (36.6 C), temperature source Oral, resp. rate 18, height 5\' 2"  (1.575 m), weight 82.6 kg, SpO2 98 %, unknown if currently breastfeeding.  Physical Exam:  General: alert, cooperative and no distress Chest: no respiratory distress Heart: regular rate, distal pulses intact Uterine Fundus: firm, appropriately tender Extremities: minimal non-pitting edema bilaterally  Skin: warm, dry MSK:  Non-tender to palpation of lumbar spine and BLE Sensation to light touch intact throughout BLE  Spontaneously able to move her lower extremity  Weak 4/5 strength bilaterally through hip and knee joints, 3/5 with dorsiflexion bilaterally Hyperreflexic 3/4 patellar reflexes bilaterally  Able to stand on her own, requires two person assist for walking, drags feet bilaterally    Recent Labs    08/09/21 1240 08/10/21 0254  HGB 8.7* 9.5*  HCT 26.7* 29.2*    Assessment/Plan: Cate Falkenstein is a 35 y.o. 34 s/p VD at [redacted]w[redacted]d   PPD#2 - Routine postpartum care  #Bilateral lower extremity weakness: Acute since postpartum s/p epidural during labor. Associated with hyperreflexia. Unchanged with improvement of ABLA as below. Spoke with anesthesiologist, Dr. [redacted]w[redacted]d, who recommended ruling out spinal epidural hematoma. Spoke with Neuroradiologist who  recommended MRI lumbar w/o contrast as image of choice, order placed. PT order placed as well. Will monitor closely.    #Acute blood loss anemia: S/p venofer and 1U pRBCs overnight, hgb this am 9.5 from 8.7. Symptoms have improved.    Contraception: Undecided Feeding: Breast Dispo: Pending workup as above.    LOS: 3 days   Hyacinth Meeker, DO OB Fellow  08/10/2021, 8:37 AM

## 2021-08-10 NOTE — Lactation Note (Signed)
This note was copied from a baby's chart. Lactation Consultation Note  Patient Name: Diana Rodgers [id number] Date: 08/10/2021 Reason for consult: Follow-up assessment;Term Age:33 hours LC entered the room mom was attempting to latch infant without pillow support. Mom open to using pillows and latching infant on her right breast using the football hold position, infant latched with depth and was still breastfeeding after 15 minutes when LC left the room. Mom knows to continue breastfeeding infant according to cues, 8 to 12+ times within 24 hours, skin to skin. Mom knows to call RN or LC if she needs further assistance with latching infant at the breast.   Maternal Data    Feeding Mother's Current Feeding Choice: Breast Milk and Donor Milk  LATCH Score Latch: Grasps breast easily, tongue down, lips flanged, rhythmical sucking.  Audible Swallowing: Spontaneous and intermittent  Type of Nipple: Everted at rest and after stimulation  Comfort (Breast/Nipple): Soft / non-tender  Hold (Positioning): Assistance needed to correctly position infant at breast and maintain latch.  LATCH Score: 9   Lactation Tools Discussed/Used    Interventions Interventions: Breast feeding basics reviewed;Assisted with latch;Skin to skin;Breast massage;Support pillows;Adjust position;Breast compression;Position options;Education  Discharge    Consult Status Consult Status: Follow-up Date: 08/10/21 Follow-up type: In-patient    Danelle Earthly 08/10/2021, 3:14 AM

## 2021-08-10 NOTE — Progress Notes (Signed)
Anesthesia was called to evaluate patient due to lower extremity weakness s/p delivery.  Patient had an epidural placed Thursday night with vaginal delivery Friday.  Patient feels weak in bilateral lower extremities.  After discussion with OB team, will get an MRI to rule out epidural hematoma as a causative factor.

## 2021-08-10 NOTE — Lactation Note (Signed)
This note was copied from a baby's chart. Lactation Consultation Note  Patient Name: Diana Rodgers [id number] Date: 08/10/2021 Reason for consult: Follow-up assessment;Infant weight loss;Early term 37-38.6wks Age:33 hours  LC in to check on P3 Mom of ET infant with weight loss of 9%.  Mom had baby latched to breast in cradle hold, hunched over her.  Noted that baby's latch looked good.  Talked with Mom about pillow support to allow baby to be elevated at breast height.  Swallows identified in suck pattern.  Breasts are heavier today, colostrum easily expressed.  Assisted Mom to latch baby on second breast in football hold.  Baby vigorously rooting and latching with guidance deeply onto breast.  Showed Mom how to do alternate breast compression to increase milk transfer.  Mom wanting to pull breast away from baby's nose.  Assisted with holding baby in closely with chin into breast.    Encouraged continued STS and offering breast often with cues.    Mom and baby to remain IP for another day due to weakness in Mom's legs.    LATCH Score Latch: Grasps breast easily, tongue down, lips flanged, rhythmical sucking.  Audible Swallowing: Spontaneous and intermittent  Type of Nipple: Everted at rest and after stimulation  Comfort (Breast/Nipple): Soft / non-tender  Hold (Positioning): Assistance needed to correctly position infant at breast and maintain latch.  LATCH Score: 9  Interventions Interventions: Assisted with latch;Skin to skin;Breast massage;Hand express;Adjust position;Support pillows;Breast compression;Position options    Consult Status Consult Status: Follow-up Date: 08/11/21 Follow-up type: In-patient    [name] 08/10/2021, 2:20 PM

## 2021-08-11 ENCOUNTER — Inpatient Hospital Stay (HOSPITAL_COMMUNITY): Payer: Medicaid Other

## 2021-08-11 ENCOUNTER — Ambulatory Visit: Payer: Medicaid Other

## 2021-08-11 DIAGNOSIS — R29898 Other symptoms and signs involving the musculoskeletal system: Secondary | ICD-10-CM

## 2021-08-11 MED ORDER — LORAZEPAM 2 MG/ML IJ SOLN
1.0000 mg | Freq: Once | INTRAMUSCULAR | Status: DC
  Filled 2021-08-11: qty 1

## 2021-08-11 MED ORDER — LORAZEPAM 1 MG PO TABS: 1.0000 mg | ORAL_TABLET | Freq: Once | ORAL | Status: DC

## 2021-08-11 MED ORDER — LORAZEPAM 2 MG/ML IJ SOLN: 1.0000 mg | Freq: Once | INTRAMUSCULAR | Status: DC

## 2021-08-11 NOTE — Consult Note (Signed)
Neurology Consultation  Reason for Consult: BLE weakness x 2 days since vaginal delivery.  Referring Physician: C. Pickens, MD.   CC: LE weakness since vaginal delivery 2 days ago.   History is obtained from: patient, chart.   HPI: Diana Rodgers is a 35 y.o. female with a PMHx of DM II with poorly controlled gestational DM who is here on the mother/baby unit following a vaginal birth 2 days ago. Epidural placed on 08/08/21 by anesthesia. On 08/10/21, patient c/o BLE weakness associated with hyperreflexia. Anesthesia recommended MRI L spine to check for epidural hematoma, but MRI was negative. PT was ordered who have recommended HHPT.    Today, prior to discharge home, OB saw patient and witnessed right foot drop with hyperreflexia at [place]. Neurology asked to see for further recommendations for weakness. Patient relates bilateral LE numbness and tingling from knee to toes on first day of symptoms, but now only to ankle. Is able to feel her toes now and knows when they are on the floor.   ROS: A robust ROS was performed and is negative except as noted in the HPI.   Past Medical History:  Diagnosis Date   Diabetes mellitus without complication ([place])    Medical history non-contributory    History reviewed. No pertinent family history.  Social History:   reports that she has never smoked. She has never used smokeless tobacco. She reports that she does not currently use alcohol. She reports that she does not use drugs.  Medications  Current Facility-Administered Medications:    0.9 %  sodium chloride infusion (Manually program via Guardrails IV Fluids), , Intravenous, Once, Gladys Damme, MD   acetaminophen (TYLENOL) tablet 650 mg, 650 mg, Oral, Q4H PRN, Eppie Gibson, MD, 650 mg at 08/09/21 0929   benzocaine-Menthol (DERMOPLAST) 20-0.5 % topical spray 1 application, 1 application, Topical, PRN, Eppie Gibson, MD   coconut oil, 1 application, Topical, PRN, Eppie Gibson,  MD   witch hazel-glycerin (TUCKS) pad 1 application, 1 application, Topical, PRN **AND** dibucaine (NUPERCAINAL) 1 % rectal ointment 1 application, 1 application, Rectal, PRN, Eppie Gibson, MD   diphenhydrAMINE (BENADRYL) capsule 25 mg, 25 mg, Oral, Q6H PRN, Eppie Gibson, MD   ibuprofen (ADVIL) tablet 600 mg, 600 mg, Oral, Q6H, Eppie Gibson, MD, 600 mg at 08/11/21 1057   measles, mumps & rubella vaccine (MMR) injection 0.5 mL, 0.5 mL, Subcutaneous, Once, Eppie Gibson, MD   ondansetron [facility]) tablet 4 mg, 4 mg, Oral, Q4H PRN **OR** ondansetron (ZOFRAN) injection 4 mg, 4 mg, Intravenous, Q4H PRN, Eppie Gibson, MD   oxyCODONE (Oxy IR/ROXICODONE) immediate release tablet 5-10 mg, 5-10 mg, Oral, Q4H PRN, Genia Del, MD, 5 mg at 08/10/21 0913   prenatal multivitamin tablet 1 tablet, 1 tablet, Oral, Q1200, Eppie Gibson, MD, 1 tablet at 08/11/21 1057   senna-docusate (Senokot-S) tablet 2 tablet, 2 tablet, Oral, Daily, Eppie Gibson, MD, 2 tablet at 08/11/21 1057   simethicone (MYLICON) chewable tablet 80 mg, 80 mg, Oral, PRN, Eppie Gibson, MD   Tdap (BOOSTRIX) injection 0.5 mL, 0.5 mL, Intramuscular, Once, Eppie Gibson, MD   zolpidem (AMBIEN) tablet 5 mg, 5 mg, Oral, QHS PRN, Eppie Gibson, MD   Exam: Current vital signs: BP 108/64 (BP Location: Right Arm)   Pulse 74   Temp 98.9 F (37.2 C) (Oral)   Resp 18   Ht 5' 2"  (1.575 m)   Wt 82.6 kg   SpO2  100%   Breastfeeding Unknown   BMI 33.29 kg/m  Vital signs in last 24 hours: Temp:  [98.4 F (36.9 C)-99 F (37.2 C)] 98.9 F (37.2 C) (08/29 0500) Pulse Rate:  [65-78] 74 (08/29 0500) Resp:  [16-18] 18 (08/29 0500) BP: (104-122)/(54-69) 108/64 (08/29 0500) SpO2:  [98 %-100 %] 100 % (08/28 2130)  PE: GENERAL: Very well appearing. Awake, alert in NAD. Baby in room.  HEENT: normocephalic and atraumatic. LUNGS - Normal respiratory effort.  CV - RRR. ABDOMEN - Soft, nontender. Ext: warm, well  perfused. Psych: affect light.   NEURO:  Mental Status: Alert and oriented x 3.  Speech/Language: speech is without dysarthria or aphasia.  Naming, repetition, fluency, and comprehension intact.  Cranial Nerves:  II: PERRL 17m/brisk. visual fields full.  III, IV, VI: EOMI. Lid elevation symmetric and full.  V: sensation is intact and symmetrical to face.  VII: Smile is symmetrical.   VIII:hearing intact to voice. IX, X: palate elevation is symmetric. Phonation normal.  XI: normal sternocleidomastoid and trapezius muscle strength. XPFX:TKWIOXis symmetrical without fasciculations.   Motor:  BUEs 5/5 except bilateral triceps are 4+/5 (giveway weakness).  RLE: Quad 3+. Knee 5/5. Dorsiflexion 4/5. Plantar flexion 4-/5. Thigh adduction 5/5. Thigh abduction 5/5. Foot inversion 4-/5. Aversion 4/5. Unable to lift leg off bed more than 3 inches and falls immediately to bed.  LLE: Quad 3+/5. Knee 5/5. Dorsiflexion 4/5. Plantar flexion 4-/5. Aversion foot 4/5. Inversion foot 4-/5.   Can swing BLE to side of bed and stand with use of walker. Step is slow with dragging of left foot. Unable to stand on right heel or toes.   Sensation:  Extinction absent to DSS for light touch.  BUEs-normal to light touch and cold.  RLE-Cold is decreased on the right at posterior calf and inside calf. Light touch is normal. Vibration is normal.  LLE: Cold is slightly decreased at outside calf, but not as much as RLE.  Tone is normal. Bulk is normal.  Sensation- Intact to light touch bilaterally in all four extremities. Extinction absent to DSS.  Cold is normal  left insice tibia. Cold dec on right. Light touch =. N/t back of calf below knee to ankle and toes 1st day, now only to ankle Can feels toes today.  Coordination: FTN intact bilaterally. She is unable to lift her LEs to perform HKS.   DTRs:  2+ throughout.  Gait- deferred.  Imaging MD reviewed the images obtained.  MRI L spine No epidural  hematoma. Small central disc protrusion at L5-S1 without stenosis.   Assessment: 35yo female who came in for a vaginal delivery and afterwards, had numbness and tingling from knees down and weakness in BLEs. This has overall improved. MRI to check for epidural hematoma was negative. This may be lumbar sacral plexus and sciatic nerve due to multilevels of dermatomes involved on her exam (L2, L3, L4, L5, S1). Doubt this is femoral neuropathy as this condition is usually accompanied by no reflexes to LEs. Suspect this is a peripheral process which should improve over time with therapy.   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  -No ambulating while carrying infant.  -Out patient neurology f/up, will make referral.   Pt seen by KClance Boll NP/Neuro and later by MD. Note/plan to be edited by MD  as needed.  Pager: [phone]  Neurology Attending Attestation   I examined the patient and discussed plan with NP. Above note has been edited by me to reflect my findings and recommendations.    Su Monks, MD [facility] [phone]   If 7pm- 7am, please page neurology on call as listed in [place].

## 2021-08-11 NOTE — Progress Notes (Signed)
Talked to Dr. Marisue Humble after he got done talking to the patient. He said the patient agreed to take small dose of ativan and he put the order in for me to give it. I went in to give the medication to the patient and she is now refusing to take it. She keeps saying she wants to leave. She said she asked for assistance with a shower earlier during dayshift and that no one came to help her. I offered to help her shower and she refused for me to help her. I notified the MD that patient wants to leave and will not take anti-anxiety meds and he said he will come talk to the patient.

## 2021-08-11 NOTE — Progress Notes (Signed)
During report dayshift nurse and I got a call from radiology tech that patient was "a little anxious" and if we could get her an order for ativan and if someone could come and give it. I told her that we were finishing report but that we would call the doctor and get the order put in and we would go down and give it. We called first call L&D and got the order. In the mean time we asked the tech to flush the IV to make sure it was fully functional. I got a call back saying it was leaking. I put in an order for an IV team consult. Once the IV was in I would go down to give the medication. I got the call that they were going to bring the patient back up because the patient was unsure if she still wanted the MRI. Another nurse called me and told me that the radiology tech left the patient in her room and the patient was crying. I went in to talk to the patient. She is very anxious and tearful. She said shes very anxious and has had a lot going on. She want to hold off on the MRI and talk to her husband her. I educated her and comforted her and told her I would let the MD know.

## 2021-08-11 NOTE — Progress Notes (Signed)
Post Partum Day 3 Subjective: Patient is doing okay this morning. She continues she eat, drink, and void well. She has passed flatus. Her pain and bleeding is controlled. Patient continues to have weakness in her lower legs. She is using a walker for assistance with ambulation and also needs 1-2 person assist. She denies back pain or pain in her lower extremities. She feels that this is largely unchanged since yesterday.   Objective: Blood pressure 108/64, pulse 74, temperature 98.9 F (37.2 C), temperature source Oral, resp. rate 18, height 5\' 2"  (1.575 m), weight 82.6 kg, SpO2 100 %, unknown if currently breastfeeding.  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   Patient continues to progress well with postpartum milestones.   #PPH:  #Acute blood loss anemia:  - No longer symptomatic s/p pRBCs and Venofer - Last Hgb 9.5   #BLE weakness: - Exam as above with decreased strength and hyperreflexia with foot drop noted during ambulation - MRI lumbar spine without obvious hematoma s/p epidural  - Patient continues to have same findings as yesterday; no improvement  - Neurology consult placed for further evaluation and recommendations   Contraception: Undecided  Feeding: Breast  Dispo: Pending further workup of BLE weakness and Neurology consult.   LOS: 4 days   36, MD [facility] Fellow  Faculty Practice  08/11/2021, 10:19 AM

## 2021-08-11 NOTE — Progress Notes (Signed)
Spoke to 1st call L&D, Leary Roca, to inform her that PT was working with patient and orders for [facility] PT and rolling walker need to be entered.  Emilio Math, RN was also informed that pt would need these prior to d/c.    Patient also asking if she can have a shower chair to use at home.

## 2021-08-11 NOTE — Progress Notes (Signed)
Met with patient at bedside after being notified by RN that patient had become panicky and refused follow-up MRI w contrast. Patient is tearful and anxious and states "I don't want anything else done to me, I just want to go home and try to walk on my own."  On further questioning, it seems patient's primary concern is the duration of the MRI: "I can't do 45 minutes, I'll freak out." When offered anxiolytics prior to exam, she repeats "I can't do it."   A&P Patient is very anxious. Unwilling to commit to MRI at this time, but is amenable to trial of Ativan for anxiolysis and to revisiting the MRI discussion at a later time. - 23m Ativan now - Will revisit consideration of MRI with Ativan later in the evening  BPearla Dubonnet MD

## 2021-08-11 NOTE — Care Management Note (Signed)
Case Management Note  Patient Details  Name: Diana Rodgers MRN: [id number] Date of Birth: 04-Aug-1986  Subjective/Objective:                  Diana Rodgers is a 35 year old G3P3003 s/p SVD complicated by [facility] and now bilateral LE weakness    Discharge planning Services  CM Consult    DME Arranged:  3-N-1, Walker rolling, Shower stool, Wheelchair manual DME Agency:  [facility]  HH Arranged:  PT HH Agency:  [facility]  Additional Comments: CM received call from RN regarding equipment and HH PT patient needing for home.  CM talked to patient and verified demographics and offered choice and patient did not have preference of agency.  CM called [facility] and they declined and then CM called [facility] [phone] and spoke to [place] and gave her referral for Home Health PT in the home and she accepted referral and said that they would start services within 48 hours of discharge. Alcario Drought would follow patient while in hospital, unclear of discharge date at this time. CM called Sheila#[phone] with [facility] and gave her referral for equipment: 3n1, rolling walker, wheelchair , shower chair and she said they would deliver to patient's room prior to discharge. RN and MD's made aware.   Gretchen Short RNC-MNN, BSN Transitions [facility]  08/11/2021, 3:22 PM

## 2021-08-11 NOTE — Progress Notes (Signed)
Pt very emotional when told study would take longer than expected. Explained that we are looking at a different area and the study could be up to 45 mins. Pt stated that she had scan yesterday and she started to ger anxious during the last few minutes of her study so she did not want to do this longer study. Attempted to get meds but RN was giving report and pt IV was leaking. Pt is still unsure if she wants to proceed with MRI or wait to see if she sees any progress on her own. I did tell her that if she decides to do the exam as an outpatient, she could request anti-anxiety meds to be sent to a pharmacy to possibly help her get through the exam.

## 2021-08-11 NOTE — Progress Notes (Signed)
Physical Therapy Treatment Patient Details Name: Diana Rodgers MRN: [id number] DOB: [birth year] Today's Date: 08/11/2021    History of Present Illness Pt is a 35 y.o. F  G3P3003 s/p VD at [redacted]w[redacted]d who presents with acute BLE weakness since postpartum s/p epidural during labor associated with hyperreflexia. Pending MRI.    PT Comments    Pt progressing well. Pt with improved bilat LE strength and reports numbness only in feet. Pt demonstrated improved movement sitting EOB via LE exercises but continues to depend on bilat UE for amb. Pt with bilat drop foot and inability to clear bilat feet during ambulation. Pt instructed to either sponge bath or sit in shower to bath to minimize risk of falling. Re-enforced pt not ambulating while holding baby. Pt reports she can enter the back of her home where there is no stairs. Recommend HHPT as pt was indep PTA and now has impaired strength, sensation, and erratic movements of bilat LEs. Acute Pt to cont to follow.    Follow Up Recommendations  Home health PT;Supervision for mobility/OOB     Equipment Recommendations  Rolling walker with 5" wheels;3in1 (PT)    Recommendations for Other Services       Precautions / Restrictions Precautions Precautions: Fall Restrictions Weight Bearing Restrictions: No    Mobility  Bed Mobility Overal bed mobility: Needs Assistance Bed Mobility: Supine to Sit     Supine to sit: Supervision     General bed mobility comments: hob elevated, increased time but able to manage LEs off bed    Transfers Overall transfer level: Needs assistance Equipment used: Rolling walker (2 wheeled) Transfers: Sit to/from Stand Sit to Stand: Min guard         General transfer comment: verbal cues for hand placement, increased time, min guardA to steady during transition of hands from bed to RW  Ambulation/Gait Ambulation/Gait assistance: Min assist Gait Distance (Feet): 30 Feet Assistive device: Rolling walker (2  wheeled) Gait Pattern/deviations: Step-through pattern;Wide base of support;Decreased stride length;Decreased dorsiflexion - right;Decreased dorsiflexion - left Gait velocity: decreased Gait velocity interpretation: <1.31 ft/sec, indicative of household ambulator General Gait Details: pt very dependent on bilat UEs, pt initally trying to swing LEs in extension, verbal cues given to bend the knee and perform hip flexion to clear foot. Pt with bilat foot drop and R foot inversion and adduction with stepping. pt with quick onset of fatigue   Stairs Stairs:  (pt stated she can go in the back where there are no stairs, educated on "up with the good, down with the bad" technique if she needed to go in the front door)           Wheelchair Mobility    Modified Rankin (Stroke Patients Only)       Balance Overall balance assessment: Needs assistance Sitting-balance support: Feet supported Sitting balance-Leahy Scale: Good     Standing balance support: Bilateral upper extremity supported Standing balance-Leahy Scale: Poor Standing balance comment: heavily reliant on RW                            Cognition Arousal/Alertness: Awake/alert Behavior During Therapy: WFL for tasks assessed/performed Overall Cognitive Status: Within Functional Limits for tasks assessed                                        Exercises General Exercises -  Lower Extremity Long Arc Quad: AROM;Both;15 reps;Standing (with 3 sec hold) Toe Raises: AROM;Both;10 reps;Seated Heel Raises: AROM;Both;10 reps;Seated    General Comments General comments (skin integrity, edema, etc.): VSS      Pertinent Vitals/Pain Pain Assessment: No/denies pain    Home Living                      Prior Function            PT Goals (current goals can now be found in the care plan section) Acute Rehab PT Goals Patient Stated Goal: go home today PT Goal Formulation: With patient Time For  Goal Achievement: 08/24/21 Potential to Achieve Goals: Good Progress towards PT goals: Progressing toward goals    Frequency    Min 3X/week      PT Plan Current plan remains appropriate    Co-evaluation              AM-PAC PT "6 Clicks" Mobility   Outcome Measure  Help needed turning from your back to your side while in a flat bed without using bedrails?: None Help needed moving from lying on your back to sitting on the side of a flat bed without using bedrails?: None Help needed moving to and from a bed to a chair (including a wheelchair)?: A Little Help needed standing up from a chair using your arms (e.g., wheelchair or bedside chair)?: A Little Help needed to walk in hospital room?: A Little Help needed climbing 3-5 steps with a railing? : A Lot 6 Click Score: 19    End of Session Equipment Utilized During Treatment: Gait belt Activity Tolerance: Patient tolerated treatment well Patient left: in bed;with call bell/phone within reach (sitting EOB) Nurse Communication: Mobility status PT Visit Diagnosis: Unsteadiness on feet (R26.81);Muscle weakness (generalized) (M62.81);Difficulty in walking, not elsewhere classified (R26.2)     Time: 6160-7371 PT Time Calculation (min) (ACUTE ONLY): 18 min  Charges:  $Gait Training: 8-22 mins                     Lewis Shock, PT, DPT Acute Rehabilitation Services Pager #: [phone] Office #: [phone]    Diana Rodgers 08/11/2021, 2:57 PM

## 2021-08-12 DIAGNOSIS — R29898 Other symptoms and signs involving the musculoskeletal system: Secondary | ICD-10-CM

## 2021-08-12 MED ORDER — IBUPROFEN 600 MG PO TABS: 600.0000 mg | ORAL_TABLET | Freq: Four times a day (QID) | ORAL | 0 refills | Status: AC

## 2021-08-12 NOTE — Progress Notes (Signed)
Physical Therapy Treatment Patient Details Name: Diana Rodgers MRN: [id number] DOB: 1986/11/19 Today's Date: 08/12/2021    History of Present Illness Pt is a 35 y.o. F  G3P3003 s/p VD at [redacted]w[redacted]d who presents with acute BLE weakness since postpartum s/p epidural during labor associated with hyperreflexia. Pending MRI.    PT Comments    Pt slowly progressing. Pt reports less numbness in feet. Pt remains to have minimal active DF bilaterally, worse in L however if placed passively in bilat DF pt can hold it. Pt unable to complete LAQ to full ROM however if placed in full extension pt able to hold it. Pt continues to require RW for safe ambulation at this time. Acute PT to cont to follow.   Follow Up Recommendations  Home health PT;Supervision for mobility/OOB     Equipment Recommendations  Rolling walker with 5" wheels;3in1 (PT)    Recommendations for Other Services       Precautions / Restrictions Precautions Precautions: Fall Restrictions Weight Bearing Restrictions: No    Mobility  Bed Mobility               General bed mobility comments: pt sitting EOB upon PT arrival    Transfers Overall transfer level: Needs assistance Equipment used: Rolling walker (2 wheeled) Transfers: Sit to/from Stand Sit to Stand: Min guard         General transfer comment: verbal cues to push up from bed not pull up on walker  Ambulation/Gait Ambulation/Gait assistance: Min assist Gait Distance (Feet): 50 Feet Assistive device: Rolling walker (2 wheeled) Gait Pattern/deviations: Step-through pattern;Wide base of support;Decreased stride length;Decreased dorsiflexion - right;Decreased dorsiflexion - left Gait velocity: dec Gait velocity interpretation: <1.31 ft/sec, indicative of household ambulator General Gait Details: pt less dependent on UEs compared to yesterday but remains heavily reliant on UEs, pt with improved bilat hip flexion but remains to have bilat drop foot inhibiting  bilat foot clearance without compensation   Stairs             Wheelchair Mobility    Modified Rankin (Stroke Patients Only)       Balance           Standing balance support: Bilateral upper extremity supported Standing balance-Leahy Scale: Poor Standing balance comment: heavily reliant on RW                            Cognition Arousal/Alertness: Awake/alert Behavior During Therapy: WFL for tasks assessed/performed Overall Cognitive Status: Within Functional Limits for tasks assessed                                 General Comments: anxious regarding not having gone home yesterday      Exercises General Exercises - Lower Extremity Long Arc Quad: AROM;Both;15 reps;Standing Hip Flexion/Marching: AROM;Both;10 reps;Seated Toe Raises: AROM;Both;10 reps;Seated Heel Raises: AROM;Both;10 reps;Seated    General Comments General comments (skin integrity, edema, etc.): VSS      Pertinent Vitals/Pain Pain Assessment: No/denies pain    Home Living                      Prior Function            PT Goals (current goals can now be found in the care plan section) Acute Rehab PT Goals Patient Stated Goal: go home Progress towards PT goals: Progressing toward goals  Frequency    Min 3X/week      PT Plan Current plan remains appropriate    Co-evaluation              AM-PAC PT "6 Clicks" Mobility   Outcome Measure  Help needed turning from your back to your side while in a flat bed without using bedrails?: None Help needed moving from lying on your back to sitting on the side of a flat bed without using bedrails?: None Help needed moving to and from a bed to a chair (including a wheelchair)?: A Little Help needed standing up from a chair using your arms (e.g., wheelchair or bedside chair)?: A Little Help needed to walk in hospital room?: A Little Help needed climbing 3-5 steps with a railing? : A Lot 6 Click  Score: 19    End of Session Equipment Utilized During Treatment: Gait belt Activity Tolerance: Patient tolerated treatment well Patient left: in bed;with call bell/phone within reach Nurse Communication: Mobility status PT Visit Diagnosis: Unsteadiness on feet (R26.81);Muscle weakness (generalized) (M62.81);Difficulty in walking, not elsewhere classified (R26.2)     Time: 9166-0600 PT Time Calculation (min) (ACUTE ONLY): 23 min  Charges:  $Gait Training: 8-22 mins $Therapeutic Exercise: 8-22 mins                     Lewis Shock, PT, DPT Acute Rehabilitation Services Pager #: [phone] Office #: [phone]    Iona Hansen 08/12/2021, 3:05 PM

## 2021-08-12 NOTE — Progress Notes (Signed)
Notified by RN that patient was refusing Ativan and desired to leave. Discussed the risks of leaving AMA without full workup of her neurological symptoms including, but not limited to, falls, debility, and death. Patient was adamant that she understood the risks but felt she could manage better at home. She felt that she had adequate support at home and was confident that she could recover her strength and walk. She assured me that if her condition worsened, that she would seek immediate emergency care.  However, her daughter has not yet been discharged by pediatrics as she does not have a pediatrician and no follow-up appointment has been scheduled. We discussed the ramifications of leaving AMA with a child and she was amenable to staying until morning. She still refuses MRI at this time.   Dorothyann Gibbs, MD

## 2021-08-12 NOTE — Plan of Care (Signed)
No charge Neurology note:   NP reviewed chart and read notes about patient wanting to leave AMA. OB has already spoken to patient about the risks of leaving without diagnosis for her extremity weakness and foot drop.   NP to room. Patient states she wants to leave, even if it is AMA. Patient refused MRI spine last p.m. NP discussed the importance of staying until we get MRI spine and clarity of etiology of her symptoms. NP stressed that if we do not find a reason and she chooses not to get it treated, she could have foot drop and leg weakness forever. Patient states she can already move her right foot better than yesterday, and she thinks she can go home and get better by herself. However, then she stated she still needed the walker to go to the bathroom. Again, NP stressed her continued issues with walking, needing assistance, and using a walker which could be permanent if she does not allow [place] to search for a reason for her symptoms. She again stated she could fix it herself and wanted to leave.   Not interested in being examined.   Neurology out patient referral has been placed.   Jimmye Norman, NP/Neurology Plan discussed with Dr. Selina Cooley who agrees.

## 2021-08-14 ENCOUNTER — Encounter: Payer: Medicaid Other | Admitting: Obstetrics and Gynecology

## 2021-08-20 ENCOUNTER — Telehealth: Payer: Self-pay | Admitting: Certified Nurse Midwife

## 2021-08-20 ENCOUNTER — Telehealth (HOSPITAL_COMMUNITY): Payer: Self-pay | Admitting: *Deleted

## 2021-08-20 NOTE — Telephone Encounter (Signed)
Patient did not show for her MyChart visit this morning but called in to report she'd overslept. Spoke with her on the phone to inquire about her leg weakness which she reports is much better. PT made a home visit yesterday and she is now ambulating well without a Fleetwood Pierron or instability. Has in-person follow up scheduled for 09/05/21 at 0915, ok to wait for further evaluation at that appointment.  Edd Arbour, CNM, MSN, IBCLC Certified Nurse Midwife, [facility]

## 2021-08-20 NOTE — Telephone Encounter (Signed)
Attempted Discharge Follow Up Call.  No voice mail, unable to leave a message.

## 2021-08-21 ENCOUNTER — Encounter: Payer: Medicaid Other | Admitting: Obstetrics and Gynecology

## 2021-08-27 ENCOUNTER — Encounter: Payer: Medicaid Other | Admitting: Obstetrics and Gynecology

## 2021-09-05 ENCOUNTER — Ambulatory Visit (INDEPENDENT_AMBULATORY_CARE_PROVIDER_SITE_OTHER): Payer: Medicaid Other

## 2021-09-05 ENCOUNTER — Telehealth: Payer: Self-pay

## 2021-09-05 ENCOUNTER — Other Ambulatory Visit: Payer: Self-pay

## 2021-09-05 DIAGNOSIS — Z3042 Encounter for surveillance of injectable contraceptive: Secondary | ICD-10-CM

## 2021-09-05 DIAGNOSIS — O24410 Gestational diabetes mellitus in pregnancy, diet controlled: Secondary | ICD-10-CM

## 2021-09-05 DIAGNOSIS — Z23 Encounter for immunization: Secondary | ICD-10-CM | POA: Diagnosis not present

## 2021-09-05 DIAGNOSIS — O24430 Gestational diabetes mellitus in the puerperium, diet controlled: Secondary | ICD-10-CM | POA: Diagnosis not present

## 2021-09-05 LAB — POCT URINE PREGNANCY: Preg Test, Ur: NEGATIVE

## 2021-09-05 MED ORDER — MEDROXYPROGESTERONE ACETATE 150 MG/ML IM SUSP
150.0000 mg | Freq: Once | INTRAMUSCULAR | Status: AC
  Administered 2021-09-05: 150 mg via INTRAMUSCULAR

## 2021-09-05 MED ORDER — MEDROXYPROGESTERONE ACETATE 150 MG/ML IM SUSP: 150.0000 mg | INTRAMUSCULAR | 3 refills | Status: AC

## 2021-09-05 NOTE — Progress Notes (Signed)
   Subjective:  Pt in for Depo Provera injection and postpartum visit.    Objective: Need for contraception. No unusual complaints.    Assessment: Pt tolerated Depo injection. Depo given Left Deltoid  Plan:  Next injection due 11/21/21-12/05/21.    Clovis Pu, RN

## 2021-09-05 NOTE — Telephone Encounter (Signed)
Attempted to call patient at 8:20 am to see if she was on her way to her appointment. She had already registered through her MyChart, but when I tried to reach her I was able to get her.

## 2021-09-05 NOTE — Progress Notes (Signed)
Post Partum Visit Note  Diana Rodgers is a 35 y.o. G42P3003 female who presents for a postpartum visit. She is 4 weeks postpartum following a normal spontaneous vaginal delivery.  I have fully reviewed the prenatal and intrapartum course. The delivery was at [redacted]w[redacted]d gestational weeks. Anesthesia: epidural. Postpartum course was complicated by PPH as well as bilateral lower extremity weakness in which PT has been working with patient. She is doing well. Has been ambulating without difficulty. Baby is doing well. Baby is feeding by breast. Bleeding staining only. Bowel function is normal. Bladder function is normal. Patient is not sexually active. Contraception method is none. Postpartum depression screening: negative.   The pregnancy intention screening data noted above was reviewed. Potential methods of contraception were discussed. The patient elected to proceed with No data recorded.   Health Maintenance Due  Topic Date Due   COVID-19 Vaccine (1) Never done    The following portions of the patient's history were reviewed and updated as appropriate: allergies, current medications, past family history, past medical history, past social history, past surgical history, and problem list.  Review of Systems Pertinent items are noted in HPI.  Objective:  BP 121/68 (BP Location: Left Arm, Patient Position: Sitting, Cuff Size: Normal)   Pulse 73   Temp 98.3 F (36.8 C) (Oral)   Ht 5\' 2"  (1.575 m)   Wt 166 lb 12.8 oz (75.7 kg)   BMI 30.51 kg/m    General:  alert, cooperative, and no distress   Breasts:  normal  Lungs: Effort normal  Heart:  regular rate and rhythm  Abdomen: soft, non-tender; bowel sounds normal; no masses,  no organomegaly   Wound N/a  GU exam:  not indicated       Assessment:   1. Diet controlled gestational diabetes mellitus (GDM), antepartum - 2 hr GTT today  - Glucose tolerance, 2 hours  2. Postpartum state - Routine PP follow up - Doing well, no  concerns  3. Need for immunization against influenza  - Flu Vaccine QUAD 36mo+IM (Fluarix, Fluzone & Alfiuria Quad PF)  4. Family planning, Depo-Provera contraception monitoring/administration - Discussed various options of contraceptive methods, patient desires Depo - UPT negative    - POCT urine pregnancy - medroxyPROGESTERone (DEPO-PROVERA) injection 150 mg - medroxyPROGESTERone (DEPO-PROVERA) 150 MG/ML injection; Inject 1 mL (150 mg total) into the muscle every 3 (three) months.  Dispense: 1 mL; Refill: 3   Normal postpartum exam.   Plan:   Essential components of care per ACOG recommendations:  1.  Mood and well being: Patient with negative depression screening today. Reviewed local resources for support.  - Patient tobacco use? No.   - hx of drug use? No.    2. Infant care and feeding:  -Patient currently breastmilk feeding?   -Social determinants of health (SDOH) reviewed in EPIC. No concerns  3. Sexuality, contraception and birth spacing - Patient does not want a pregnancy in the next year. Desired family size is 4 children.  - Reviewed forms of contraception in tiered fashion. Patient desired Depo-Provera today.   - Discussed birth spacing of 18 months  4. Sleep and fatigue -Encouraged family/partner/community support of 4 hrs of uninterrupted sleep to help with mood and fatigue  5. Physical Recovery  - Discussed patients delivery and complications. She describes her labor as good. - Patient had a Vaginal problems after delivery including PPH and bilateral lower extremity weakness. Patient was evaluated by neuro s/p SVD, currently seeing PT. Doing well.  Ambulating without difficulty . Patient had  no  laceration. Perineal healing reviewed. Patient expressed understanding - Patient has urinary incontinence? No. - Patient is safe to resume physical and sexual activity  6.  Health Maintenance - HM due items addressed Yes - Last pap smear  Diagnosis  Date Value Ref  Range Status  04/24/2021   Final   - Negative for intraepithelial lesion or malignancy (NILM)   Pap smear not done at today's visit.  -Breast Cancer screening indicated? No.   7. Chronic Disease/Pregnancy Condition follow up: Gestational Diabetes - 2 hr GTT today - PCP follow up prn    Brand Males, CNM [facility] [employer]

## 2021-09-06 LAB — GLUCOSE TOLERANCE, 2 HOURS
Glucose, 2 hour: 110 mg/dL (ref 65–139)
Glucose, GTT - Fasting: 92 mg/dL (ref 65–99)

## 2021-11-24 ENCOUNTER — Ambulatory Visit (INDEPENDENT_AMBULATORY_CARE_PROVIDER_SITE_OTHER): Payer: Medicaid Other | Admitting: *Deleted

## 2021-11-24 ENCOUNTER — Other Ambulatory Visit: Payer: Self-pay

## 2021-11-24 VITALS — BP 122/88 | HR 84 | Temp 98.0°F | Wt 170.6 lb

## 2021-11-24 DIAGNOSIS — Z3042 Encounter for surveillance of injectable contraceptive: Secondary | ICD-10-CM

## 2021-11-24 MED ORDER — MEDROXYPROGESTERONE ACETATE 150 MG/ML IM SUSP
150.0000 mg | Freq: Once | INTRAMUSCULAR | Status: AC
  Administered 2021-11-24: 150 mg via INTRAMUSCULAR

## 2021-11-24 NOTE — Progress Notes (Signed)
   Subjective:  Pt in for Depo Provera injection.    Objective: Need for contraception. No unusual complaints.    Assessment: Pt tolerated Depo injection. Depo given Left Deltoid.   Plan:  Next injection due 02/09/2022-02/23/2022.    Clovis Pu, RN

## 2021-12-08 ENCOUNTER — Ambulatory Visit: Payer: Medicaid Other

## 2022-01-23 ENCOUNTER — Telehealth: Payer: Self-pay

## 2022-01-23 NOTE — Telephone Encounter (Signed)
Pt called stating she is noticing spotting when wipes.Pt is on Depo, and pt is breastfeeding. Advised pt irregular bleeding is a common side effect of depo. Pt c/o urinary frequency and vaginal pain. Recommended pt to come in Monday for urine dip and self swab. Pt states she does not have transportation. Pt states she will monitor her symptoms and call back if symptoms worsen.  Advised pt testing can be completed at her 2/27 appt.  Pt verbalizes understanding at this time.

## 2022-02-09 ENCOUNTER — Ambulatory Visit: Payer: Medicaid Other

## 2022-02-09 ENCOUNTER — Other Ambulatory Visit: Payer: Self-pay

## 2022-02-09 ENCOUNTER — Ambulatory Visit (INDEPENDENT_AMBULATORY_CARE_PROVIDER_SITE_OTHER): Payer: Medicaid Other

## 2022-02-09 VITALS — BP 142/76 | HR 78 | Wt 176.6 lb

## 2022-02-09 DIAGNOSIS — Z3042 Encounter for surveillance of injectable contraceptive: Secondary | ICD-10-CM

## 2022-02-09 MED ORDER — MEDROXYPROGESTERONE ACETATE 150 MG/ML IM SUSP
150.0000 mg | Freq: Once | INTRAMUSCULAR | Status: AC
  Administered 2022-02-09: 150 mg via INTRAMUSCULAR

## 2022-02-09 NOTE — Progress Notes (Signed)
Diana Rodgers here for Depo-Provera Injection. Injection administered without complication. Patient will return in 3 months for next injection between 04/27/22 and 05/11/22. Next annual visit due September 2023.   Pt states she would like to try for pregnancy later this year. Last delivery 08/08/22. Pt would like last Depo Provera injection in August 2023 and would then like to meet with provider to discuss trying for pregnancy. Pt understands that she would not be able to begin trying for pregnancy for over 3 months after last injection.  Marjo Bicker, RN 02/09/2022  9:55 AM

## 2022-04-23 ENCOUNTER — Telehealth: Payer: Self-pay | Admitting: Family Medicine

## 2022-04-23 NOTE — Telephone Encounter (Signed)
Want to know if she can change her [facility], said the depo is giving her nose bleeds, and she have hives. X- a few months.  ?

## 2022-04-24 ENCOUNTER — Encounter: Payer: Self-pay | Admitting: Family Medicine

## 2022-04-24 ENCOUNTER — Telehealth (INDEPENDENT_AMBULATORY_CARE_PROVIDER_SITE_OTHER): Payer: Medicaid Other | Admitting: Family Medicine

## 2022-04-24 ENCOUNTER — Other Ambulatory Visit: Payer: Self-pay | Admitting: Family Medicine

## 2022-04-24 DIAGNOSIS — G43009 Migraine without aura, not intractable, without status migrainosus: Secondary | ICD-10-CM | POA: Insufficient documentation

## 2022-04-24 DIAGNOSIS — Z30011 Encounter for initial prescription of contraceptive pills: Secondary | ICD-10-CM | POA: Diagnosis not present

## 2022-04-24 MED ORDER — SLYND 4 MG PO TABS: 1.0000 | ORAL_TABLET | Freq: Every day | ORAL | 3 refills | Status: AC

## 2022-04-24 NOTE — Telephone Encounter (Signed)
I called patient to discuss what issues she is having with her Depo. Patient states ever since starting the Depo in September 2022 she has been experiencing nose bleeds, a skin rash and migraines on and off. Patient is requesting to switch from Depo to oral contraceptives or the birth control patch. I spoke with Dr. Kennon Rounds to see if we could send in a prescription for the patient, per Dr. Kennon Rounds patient should be seen by a provider either in person or virtually to discuss birth control options. Virtual appointment scheduled for patient to see provider. I reviewed this with patient. Patient verbalized understanding.  ? ?Paulina Fusi, RN ?04/24/22 ?

## 2022-04-24 NOTE — Progress Notes (Signed)
? ? ?  GYNECOLOGY VIRTUAL VISIT ENCOUNTER NOTE ? ?Provider location: [facility] [employer] [facility]  ? ?Patient location: Home ? ?I connected with Diana Rodgers on 04/24/22 at 10:15 AM EDT by MyChart Video Encounter and verified that I am speaking with the correct person using two identifiers. ?  ?I discussed the limitations, risks, security and privacy concerns of performing an evaluation and management service virtually and the availability of in person appointments. I also discussed with the patient that there may be a patient responsible charge related to this service. The patient expressed understanding and agreed to proceed. ?  ?History:  ?Diana Rodgers is a 36 y.o. [id number] female being evaluated today for contraception issues. On depo since giving birth and having a rash with depo and nose bleeds as well. Continues to nurse. She denies any abnormal vaginal discharge, bleeding, pelvic pain or other concerns.   ?  ?  ?Past Medical History:  ?Diagnosis Date  ? Diabetes mellitus without complication (HCC)   ? Medical history non-contributory   ? ?Past Surgical History:  ?Procedure Laterality Date  ? APPENDECTOMY    ? ?The following portions of the patient's history were reviewed and updated as appropriate: allergies, current medications, past family history, past medical history, past social history, past surgical history and problem list.  ? ?Health Maintenance:  Normal pap and negative HRHPV on 2022.   ? ?Review of Systems:  ?Pertinent items noted in HPI and remainder of comprehensive ROS otherwise negative. ? ?Physical Exam:  ? ?General:  Alert, oriented and cooperative. Patient appears to be in no acute distress.  ?Mental Status: Normal mood and affect. Normal behavior. Normal judgment and thought content.   ?Respiratory: Normal respiratory effort, no problems with respiration noted  ?Rest of physical exam deferred due to type of encounter ? ?Labs and Imaging ?No results found for  this or any previous visit (from the past 336 hour(s)). ?No results found.   ?  ?Assessment and Plan:  ?   ?Encounter for initial prescription of contraceptive pills - still nursing so will change to POPs - Plan: Drospirenone (SLYND) 4 MG TABS ? ?Migraine without aura and without status migrainosus, not intractable - will refrain from COC use ? ?   ?  ?I discussed the assessment and treatment plan with the patient. The patient was provided an opportunity to ask questions and all were answered. The patient agreed with the plan and demonstrated an understanding of the instructions. ?  ?The patient was advised to call back or seek an in-person evaluation/go to the ED if the symptoms worsen or if the condition fails to improve as anticipated. ? ?I provided 15 minutes of face-to-face time during this encounter. ? ? ?Reva Bores, MD ?[facility] ? ?

## 2022-04-30 ENCOUNTER — Ambulatory Visit: Payer: Medicaid Other

## 2022-06-26 ENCOUNTER — Telehealth: Payer: Medicaid Other | Admitting: Certified Nurse Midwife

## 2022-09-18 IMAGING — US US MFM OB COMP +14 WKS
1 series · 13 of 28 positions shown · non-contrast
Comparison: none

[Series 1: us mfm ob comp +14 wks · 13 of 85 slices shown]
[im 4/85]
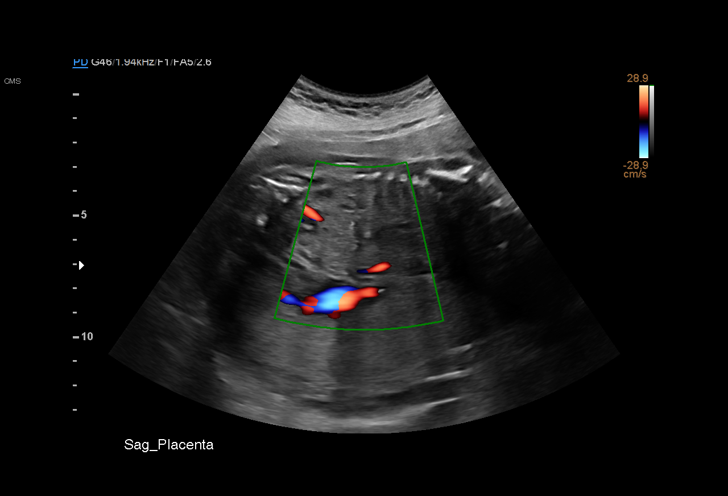
[im 10/85]
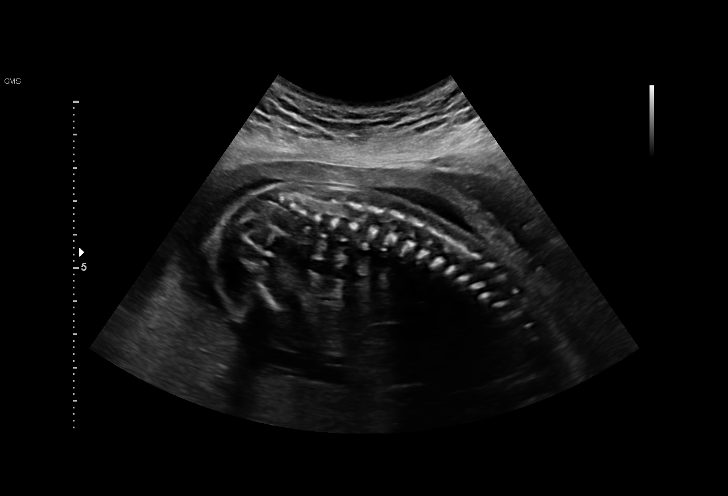
[im 16/85]
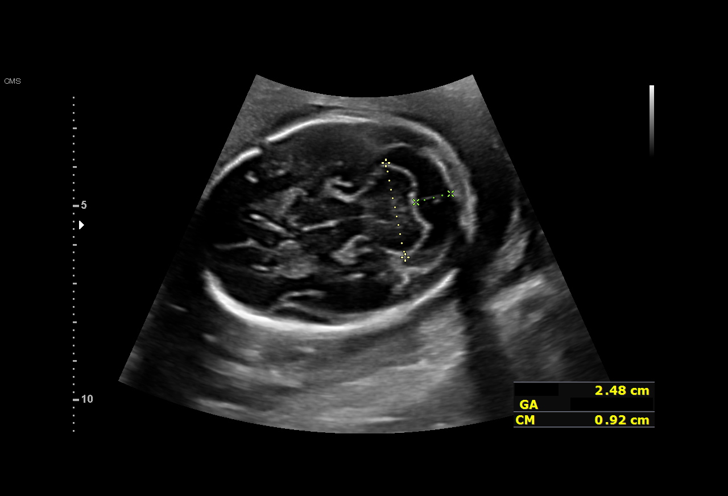
[im 22/85]
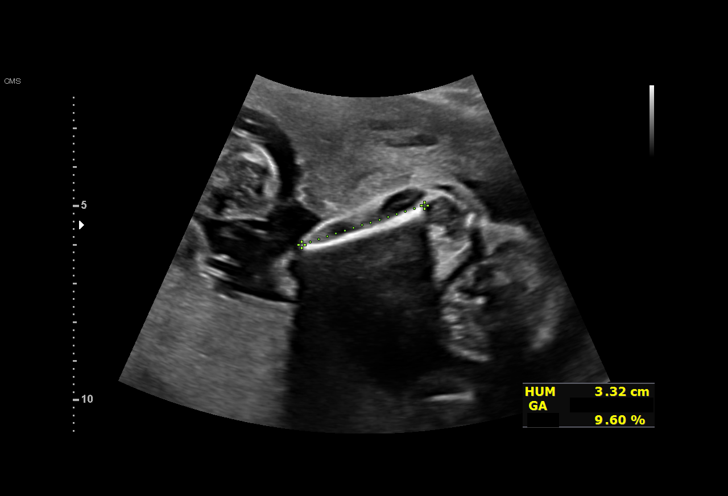
[im 29/85]
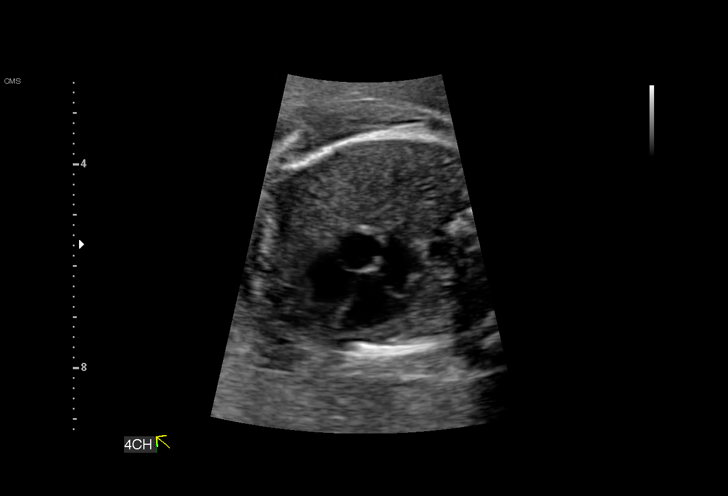
[im 35/85]
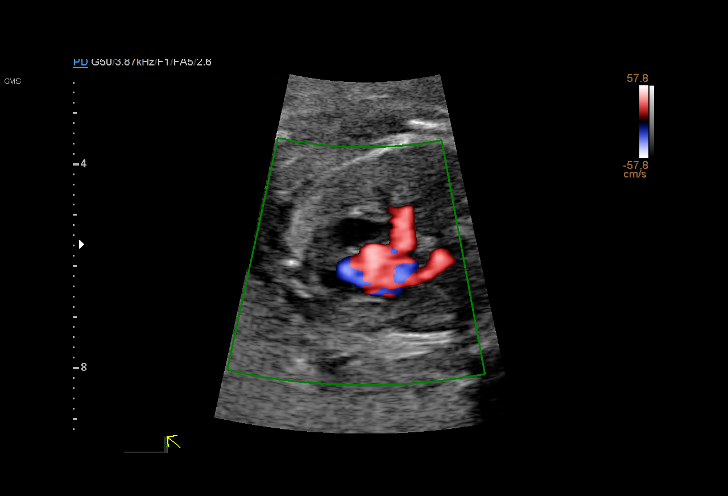
[im 44/85]
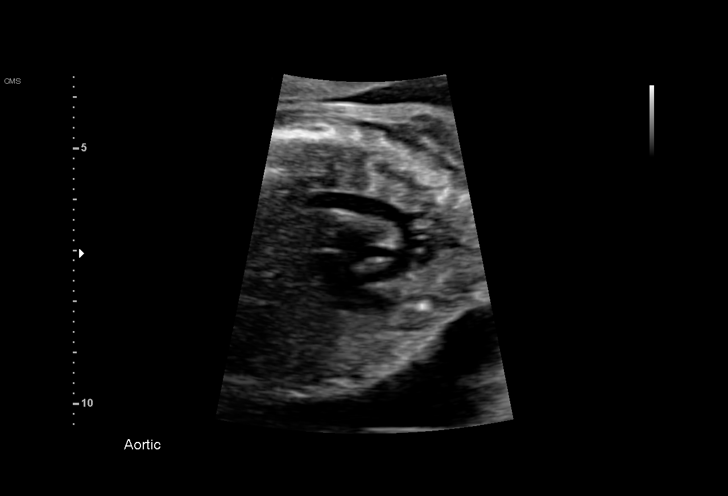
[im 50/85]
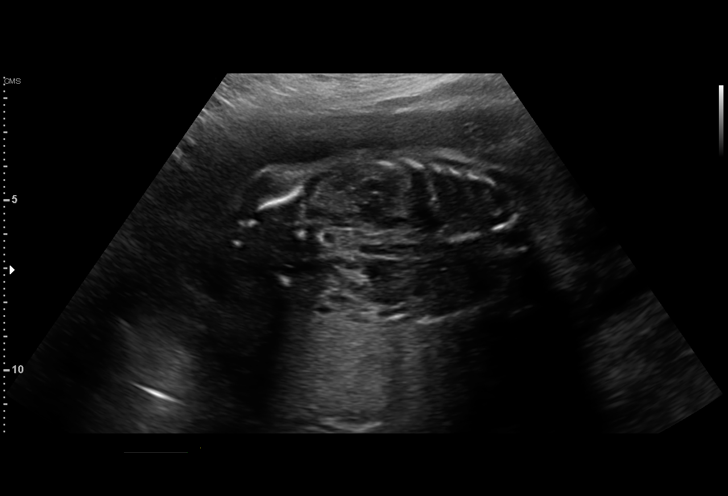
[im 57/85]
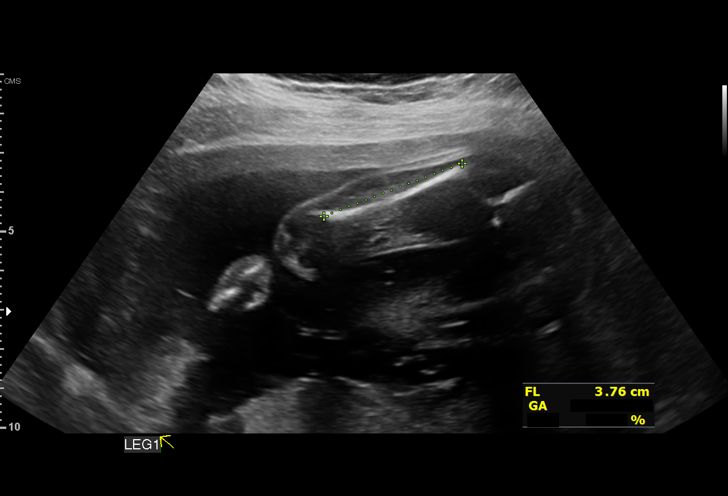
[im 63/85]
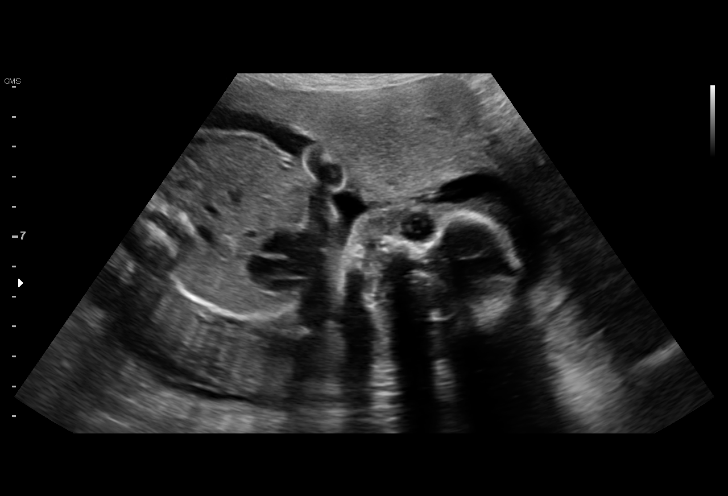
[im 69/85]
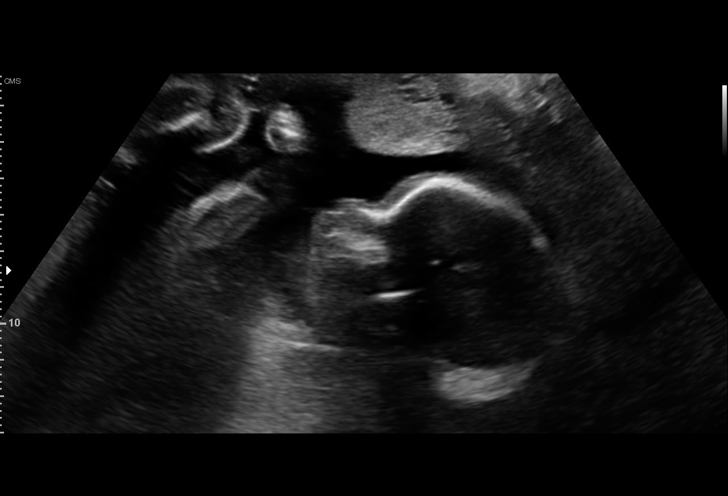
[im 75/85]
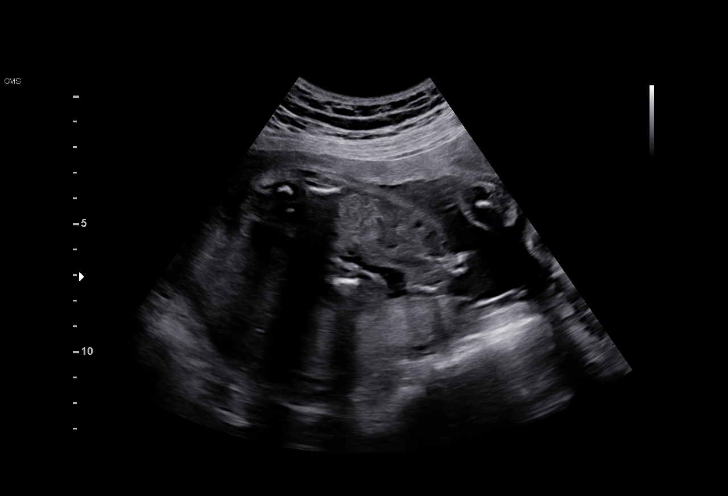
[im 81/85]
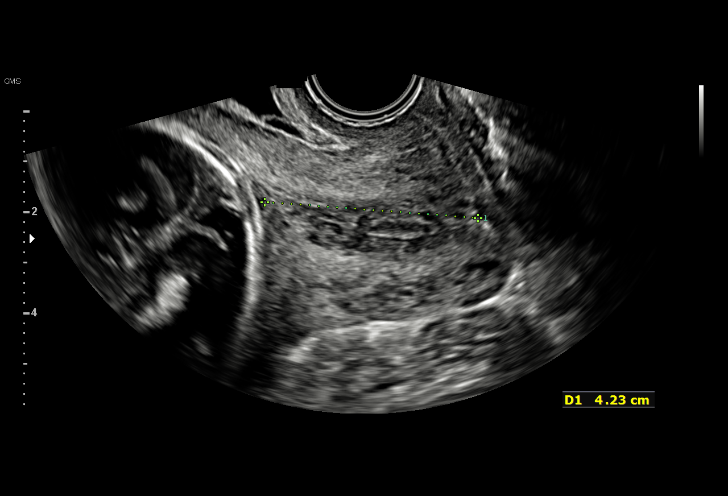

[13 of 28 positions shown; findings below may reference images not displayed]

ALLAN BO CNM                                [HOSPITAL] at

 1  US MFM OB COMP + 14 WK                76805.01    [facility]
 2  US MFM OB TRANSVAGINAL                76817.2     [facility]

Indications

 Insufficient Prenatal Care
 23 weeks gestation of pregnancy
 Encounter for antenatal screening for
 malformations
 Neg AFP
Fetal Evaluation

 Num Of Fetuses:         1
 Fetal Heart Rate(bpm):  141
 Cardiac Activity:       Observed
 Presentation:           Cephalic
 Placenta:               Posterior
 P. Cord Insertion:      Visualized, central

 Amniotic Fluid
 AFI FV:      Within normal limits
Biometry

 BPD:        53  mm     G. Age:  22w 1d         15  %    CI:        68.55   %    70 - 86
                                                         FL/HC:      18.4   %    19.2 -
 HC:      204.6  mm     G. Age:  22w 4d         20  %    HC/AC:      1.10        1.05 -
 AC:      185.5  mm     G. Age:  23w 2d         53  %    FL/BPD:     70.9   %    71 - 87
 FL:       37.6  mm     G. Age:  22w 0d         12  %    FL/AC:      20.3   %    20 - 24
 HUM:      33.2  mm     G. Age:  21w 1d          7  %
 CER:      24.8  mm     G. Age:  22w 5d         68  %
 LV:        4.7  mm
 CM:        9.2  mm
 Est. FW:     527  gm      1 lb 3 oz     29  %
OB History

 Gravidity:    3         Term:   2        Prem:   0        SAB:   0
 TOP:          0       Ectopic:  0        Living: 2
Gestational Age

 U/S Today:     22w 4d                                        EDD:   08/25/21
 Best:          23w 0d     Det. By:  Early Ultrasound         EDD:   08/22/21
Anatomy

 Cranium:               Appears normal         Aortic Arch:            Appears normal
 Cavum:                 Appears normal         Ductal Arch:            Appears normal
 Ventricles:            Appears normal         Diaphragm:              Appears normal
 Choroid Plexus:        Appears normal         Stomach:                Appears normal, left
                                                                       sided
 Cerebellum:            Appears normal         Abdomen:                Appears normal
 Posterior Fossa:       Appears normal         Abdominal Wall:         Appears nml (cord
                                                                       insert, abd wall)
 Nuchal Fold:           Not applicable (>20    Cord Vessels:           Appears normal (3
                        wks GA)                                        vessel cord)
 Face:                  Appears normal         Kidneys:                Appear normal
                        (orbits and profile)
 Lips:                  Appears normal         Bladder:                Appears normal
 Thoracic:              Appears normal         Spine:                  Appears normal
 Heart:                 Appears normal         Upper Extremities:      Appears normal
                        (4CH, axis, and
                        situs)
 RVOT:                  Appears normal         Lower Extremities:      Appears normal
 LVOT:                  Not well visualized

 Other:  Fetus appears to be female. VC, 3VV and 3VTV visualized.
Cervix Uterus Adnexa

 Cervix
 Length:           4.23  cm.
 Normal appearance by transvaginal scan

 Right Ovary
 Within normal limits.

 Left Ovary
 Not visualized.
Comments

 This patient was seen for a detailed fetal anatomy scan.  The
 patient reports that during her last prenatal visit, her cervix
 was felt to be open and possibly short on exam.  She reports
 two prior uncomplicated full-term vaginal deliveries.  She
 denies feeling any contractions or lower abdominal cramping.
 She denies any significant past medical history and denies
 any problems in her current pregnancy.
 She had a cell free DNA test earlier in her pregnancy which
 indicated a low risk for trisomy 21, 18, and 13. A female fetus
 is predicted.
 She was informed that the fetal growth and amniotic fluid
 level were appropriate for her gestational age.
 There were no obvious fetal anomalies noted on today's
 ultrasound exam.  However, the views of the fetal anatomy
 were limited today due to the fetal position.
 The patient was informed that anomalies may be missed due
 to technical limitations. If the fetus is in a suboptimal position
 or maternal habitus is increased, visualization of the fetus in
 the maternal uterus may be impaired.
 As her cervix was felt to be open and possibly short, a
 transvaginal ultrasound was performed today to assess the
 cervical length.  Her cervix measured 4.23 cm long without
 any signs of funneling.  The cervix did not appear to be open.
 The patient was reassured that based on this ultrasound
 finding, her risk of a preterm birth is low.
 A follow-up exam was scheduled in 4 weeks to complete the
 views of the fetal anatomy.
